# Patient Record
Sex: Female | Born: 1965 | Race: Black or African American | Hispanic: No | Smoking: Never smoker
Health system: Southern US, Community
[De-identification: ages and names within clinical notes are randomized; demographics above are authoritative.]

## PROBLEM LIST (undated history)

## (undated) ENCOUNTER — Emergency Department (HOSPITAL_COMMUNITY): Admission: EM | Payer: Self-pay | Source: Home / Self Care

## (undated) DIAGNOSIS — I10 Essential (primary) hypertension: Secondary | ICD-10-CM

## (undated) DIAGNOSIS — M5430 Sciatica, unspecified side: Secondary | ICD-10-CM

## (undated) DIAGNOSIS — J45909 Unspecified asthma, uncomplicated: Secondary | ICD-10-CM

## (undated) DIAGNOSIS — S83249A Other tear of medial meniscus, current injury, unspecified knee, initial encounter: Secondary | ICD-10-CM

## (undated) DIAGNOSIS — E119 Type 2 diabetes mellitus without complications: Secondary | ICD-10-CM

## (undated) DIAGNOSIS — D219 Benign neoplasm of connective and other soft tissue, unspecified: Secondary | ICD-10-CM

## (undated) HISTORY — PX: ABDOMINAL HYSTERECTOMY: SHX81

## (undated) HISTORY — PX: CHOLECYSTECTOMY: SHX55

## (undated) HISTORY — PX: TUBAL LIGATION: SHX77

---

## 1999-03-24 ENCOUNTER — Encounter: Admission: RE | Admit: 1999-03-24 | Discharge: 1999-04-14 | Payer: Self-pay | Admitting: Unknown Physician Specialty

## 2002-05-22 ENCOUNTER — Emergency Department (HOSPITAL_COMMUNITY): Admission: EM | Admit: 2002-05-22 | Discharge: 2002-05-22 | Payer: Self-pay | Admitting: Emergency Medicine

## 2008-12-15 ENCOUNTER — Emergency Department (HOSPITAL_COMMUNITY): Admission: EM | Admit: 2008-12-15 | Discharge: 2008-12-15 | Payer: Self-pay | Admitting: Emergency Medicine

## 2012-02-26 ENCOUNTER — Encounter (HOSPITAL_COMMUNITY): Payer: Self-pay | Admitting: *Deleted

## 2012-02-26 ENCOUNTER — Emergency Department (HOSPITAL_COMMUNITY): Payer: Self-pay

## 2012-02-26 ENCOUNTER — Emergency Department (HOSPITAL_COMMUNITY)
Admission: EM | Admit: 2012-02-26 | Discharge: 2012-02-26 | Disposition: A | Discharge status: Home / Self Care | Payer: Self-pay | Attending: Emergency Medicine | Admitting: Emergency Medicine

## 2012-02-26 DIAGNOSIS — M25561 Pain in right knee: Secondary | ICD-10-CM

## 2012-02-26 DIAGNOSIS — M5430 Sciatica, unspecified side: Secondary | ICD-10-CM | POA: Insufficient documentation

## 2012-02-26 DIAGNOSIS — M25569 Pain in unspecified knee: Secondary | ICD-10-CM | POA: Insufficient documentation

## 2012-02-26 HISTORY — DX: Sciatica, unspecified side: M54.30

## 2012-02-26 MED ORDER — HYDROCODONE-ACETAMINOPHEN 5-325 MG PO TABS: 1.0000 | ORAL_TABLET | Freq: Four times a day (QID) | ORAL | Status: AC | PRN

## 2012-02-26 NOTE — ED Provider Notes (Signed)
Medical screening examination/treatment/procedure(s) were performed by non-physician practitioner and as supervising physician I was immediately available for consultation/collaboration.  Raeford Razor, MD 02/26/12 1326

## 2012-02-26 NOTE — ED Notes (Signed)
Right knee pain x 4 days ago.  Denies injury.

## 2012-02-26 NOTE — ED Provider Notes (Signed)
History     CSN: [id number]  Arrival date & time 02/26/12  0915   First MD Initiated Contact with Patient 02/26/12 [phone]      Chief Complaint  Patient presents with  . Knee Pain    (Consider location/radiation/quality/duration/timing/severity/associated sxs/prior treatment) HPI Comments: No known injury.  No h/o gout.  No fever.  No pre-existing problem with knee.  Taking aleve with no relief.  No radiation.  Localizes to back of knee and laterally.  Patient is a 46 y.o. female presenting with knee pain. The history is provided by the patient. No language interpreter was used.  Knee Pain This is a new problem. Episode onset: 1 week ago. The problem occurs constantly. The problem has been gradually worsening. Pertinent negatives include no chills, fever or numbness. The symptoms are aggravated by walking and standing. She has tried NSAIDs for the symptoms. The treatment provided no relief.    Past Medical History  Diagnosis Date  . Sciatica     Past Surgical History  Procedure Date  . Tubal ligation   . Cholecystectomy     No family history on file.  History  Substance Use Topics  . Smoking status: Never Smoker   . Smokeless tobacco: Not on file  . Alcohol Use: No    OB History    Grav Para Term Preterm Abortions TAB SAB Ect Mult Living                  Review of Systems  Constitutional: Negative for fever and chills.  Musculoskeletal:       Knee pain   Neurological: Negative for numbness.  All other systems reviewed and are negative.    Allergies  Review of patient's allergies indicates no known allergies.  Home Medications   Current Outpatient Rx  Name Route Sig Dispense Refill  . NAPROXEN SODIUM 220 MG PO TABS Oral Take 220 mg by mouth as needed. For pain      BP 165/88  Pulse 82  Temp 98.1 F (36.7 C) (Oral)  Resp 16  Ht 5\' 4"  (1.626 m)  Wt 280 lb (127.007 kg)  BMI 48.06 kg/m2  SpO2 100%  LMP 02/12/2012  Physical Exam  Nursing note and  vitals reviewed. Constitutional: She is oriented to person, place, and time. She appears well-developed and well-nourished. No distress.  HENT:  Head: Normocephalic and atraumatic.  Eyes: EOM are normal.  Neck: Normal range of motion.  Cardiovascular: Normal rate, regular rhythm and normal heart sounds.   Pulmonary/Chest: Effort normal and breath sounds normal.  Abdominal: Soft. She exhibits no distension. There is no tenderness.  Musculoskeletal: She exhibits tenderness.       Right knee: She exhibits decreased range of motion. She exhibits no swelling, no ecchymosis, no deformity, no laceration and no erythema. tenderness found. Lateral joint line tenderness noted.       Pain primarily with movement.  Localizes posteriorly and laterally.  Difficult to examine secondary to body habitus.  Neurological: She is alert and oriented to person, place, and time.  Skin: Skin is warm and dry.  Psychiatric: She has a normal mood and affect. Judgment normal.    ED Course  Procedures (including critical care time)  Labs Reviewed - No data to display [place] Extrem Low Right Ltd  02/26/2012  *RADIOLOGY REPORT*  Clinical Data: Pain in the popliteal fossa with difficulty bearing weight.  ULTRASOUND RIGHT LOWER EXTREMITY LIMITED  Technique:  Ultrasound examination of the region of interest in  the right lower extremity was performed.  Comparison:  Radiographs 02/26/2012.  Findings: No masses or fluid collections are identified within the right popliteal fossa.  IMPRESSION: No abnormality identified within the popliteal fossa.  If the patient has persistent unexplained pain, further evaluation with MRI should be considered.   Original Report Authenticated By: Gerrianne Scale, M.D.    Dg Knee Complete 4 Views Right  02/26/2012  *RADIOLOGY REPORT*  Clinical Data: Knee pain  RIGHT KNEE - COMPLETE 4+ VIEW  Comparison: None.  Findings: No evidence for fracture or dislocation.  There is no joint effusion.  No  worrisome lytic or sclerotic osseous lesion.  IMPRESSION: Normal exam.   Original Report Authenticated By: ERIC A. MANSELL, M.D.      1. Right knee pain       MDM  Knee immobilizer, crutches, ice, weight bearing as tolerated. rx-hydrocodone, 20 F/u with dr. Romeo Apple.        Evalina Field, [place] 02/26/12 1236

## 2013-09-06 ENCOUNTER — Telehealth: Payer: Self-pay | Admitting: Family Medicine

## 2013-09-06 NOTE — Telephone Encounter (Signed)
Patient seen at [facility] twice over the past 2 weeks.  They suggested an MRI. She doesn't have a primary doctor. Had been seen at [facility] but now has insurance. Appt scheduled with Dr. Ernestina Patches for 09/11/13. Patient aware.

## 2013-09-11 ENCOUNTER — Ambulatory Visit: Payer: Self-pay | Admitting: Family Medicine

## 2013-09-19 ENCOUNTER — Other Ambulatory Visit: Payer: Self-pay | Admitting: Neurosurgery

## 2013-09-19 DIAGNOSIS — M5020 Other cervical disc displacement, unspecified cervical region: Secondary | ICD-10-CM

## 2013-09-24 ENCOUNTER — Ambulatory Visit
Admission: RE | Admit: 2013-09-24 | Discharge: 2013-09-24 | Disposition: A | Discharge status: Home / Self Care | Payer: BC Managed Care – PPO | Source: Ambulatory Visit | Attending: Neurosurgery | Admitting: Neurosurgery

## 2013-09-24 DIAGNOSIS — M5020 Other cervical disc displacement, unspecified cervical region: Secondary | ICD-10-CM

## 2014-09-11 ENCOUNTER — Encounter (HOSPITAL_COMMUNITY): Payer: Self-pay | Admitting: *Deleted

## 2014-09-11 ENCOUNTER — Emergency Department (HOSPITAL_COMMUNITY)
Admission: EM | Admit: 2014-09-11 | Discharge: 2014-09-11 | Disposition: A | Discharge status: Home / Self Care | Payer: Self-pay | Attending: Emergency Medicine | Admitting: Emergency Medicine

## 2014-09-11 DIAGNOSIS — Z9049 Acquired absence of other specified parts of digestive tract: Secondary | ICD-10-CM | POA: Insufficient documentation

## 2014-09-11 DIAGNOSIS — Z8742 Personal history of other diseases of the female genital tract: Secondary | ICD-10-CM | POA: Insufficient documentation

## 2014-09-11 DIAGNOSIS — M5430 Sciatica, unspecified side: Secondary | ICD-10-CM | POA: Insufficient documentation

## 2014-09-11 DIAGNOSIS — Z9851 Tubal ligation status: Secondary | ICD-10-CM | POA: Insufficient documentation

## 2014-09-11 DIAGNOSIS — R1012 Left upper quadrant pain: Secondary | ICD-10-CM | POA: Insufficient documentation

## 2014-09-11 DIAGNOSIS — I10 Essential (primary) hypertension: Secondary | ICD-10-CM | POA: Insufficient documentation

## 2014-09-11 DIAGNOSIS — Z79899 Other long term (current) drug therapy: Secondary | ICD-10-CM | POA: Insufficient documentation

## 2014-09-11 HISTORY — DX: Essential (primary) hypertension: I10

## 2014-09-11 MED ORDER — HYDROMORPHONE HCL 1 MG/ML IJ SOLN
1.0000 mg | Freq: Once | INTRAMUSCULAR | Status: AC
  Administered 2014-09-11: 1 mg via INTRAMUSCULAR
  Filled 2014-09-11: qty 1

## 2014-09-11 MED ORDER — POLYETHYLENE GLYCOL 3350 17 G PO PACK: 17.0000 g | PACK | Freq: Two times a day (BID) | ORAL | Status: DC | PRN

## 2014-09-11 MED ORDER — OXYCODONE-ACETAMINOPHEN 5-325 MG PO TABS: 1.0000 | ORAL_TABLET | ORAL | Status: DC | PRN

## 2014-09-11 MED ORDER — KETOROLAC TROMETHAMINE 30 MG/ML IJ SOLN
30.0000 mg | Freq: Once | INTRAMUSCULAR | Status: AC
  Administered 2014-09-11: 30 mg via INTRAMUSCULAR
  Filled 2014-09-11: qty 1

## 2014-09-11 NOTE — ED Notes (Signed)
abd pain, feels constipated, Seen at [facility] yesterday and had ct of abd  Done.Nausea, no vomiting,  No fever

## 2014-09-11 NOTE — Discharge Instructions (Signed)
Abdominal Pain, Women °Abdominal (stomach, pelvic, or belly) pain can be caused by many things. It is important to tell your doctor: °· The location of the pain. °· Does it come and go or is it present all the time? °· Are there things that start the pain (eating certain foods, exercise)? °· Are there other symptoms associated with the pain (fever, nausea, vomiting, diarrhea)? °All of this is helpful to know when trying to find the cause of the pain. °CAUSES  °· Stomach: virus or bacteria infection, or ulcer. °· Intestine: appendicitis (inflamed appendix), regional ileitis (Crohn's disease), ulcerative colitis (inflamed colon), irritable bowel syndrome, diverticulitis (inflamed diverticulum of the colon), or cancer of the stomach or intestine. °· Gallbladder disease or stones in the gallbladder. °· Kidney disease, kidney stones, or infection. °· Pancreas infection or cancer. °· Fibromyalgia (pain disorder). °· Diseases of the female organs: °¨ Uterus: fibroid (non-cancerous) tumors or infection. °¨ Fallopian tubes: infection or tubal pregnancy. °¨ Ovary: cysts or tumors. °¨ Pelvic adhesions (scar tissue). °¨ Endometriosis (uterus lining tissue growing in the pelvis and on the pelvic organs). °¨ Pelvic congestion syndrome (female organs filling up with blood just before the menstrual period). °¨ Pain with the menstrual period. °¨ Pain with ovulation (producing an egg). °¨ Pain with an IUD (intrauterine device, birth control) in the uterus. °¨ Cancer of the female organs. °· Functional pain (pain not caused by a disease, may improve without treatment). °· Psychological pain. °· Depression. °DIAGNOSIS  °Your doctor will decide the seriousness of your pain by doing an examination. °· Blood tests. °· X-rays. °· Ultrasound. °· CT scan (computed tomography, special type of X-ray). °· MRI (magnetic resonance imaging). °· Cultures, for infection. °· Barium enema (dye inserted in the large intestine, to better view it with  X-rays). °· Colonoscopy (looking in intestine with a lighted tube). °· Laparoscopy (minor surgery, looking in abdomen with a lighted tube). °· Major abdominal exploratory surgery (looking in abdomen with a large incision). °TREATMENT  °The treatment will depend on the cause of the pain.  °· Many cases can be observed and treated at home. °· Over-the-counter medicines recommended by your caregiver. °· Prescription medicine. °· Antibiotics, for infection. °· Birth control pills, for painful periods or for ovulation pain. °· Hormone treatment, for endometriosis. °· Nerve blocking injections. °· Physical therapy. °· Antidepressants. °· Counseling with a psychologist or psychiatrist. °· Minor or major surgery. °HOME CARE INSTRUCTIONS  °· Do not take laxatives, unless directed by your caregiver. °· Take over-the-counter pain medicine only if ordered by your caregiver. Do not take aspirin because it can cause an upset stomach or bleeding. °· Try a clear liquid diet (broth or water) as ordered by your caregiver. Slowly move to a bland diet, as tolerated, if the pain is related to the stomach or intestine. °· Have a thermometer and take your temperature several times a day, and record it. °· Bed rest and sleep, if it helps the pain. °· Avoid sexual intercourse, if it causes pain. °· Avoid stressful situations. °· Keep your follow-up appointments and tests, as your caregiver orders. °· If the pain does not go away with medicine or surgery, you may try: °¨ Acupuncture. °¨ Relaxation exercises (yoga, meditation). °¨ Group therapy. °¨ Counseling. °SEEK MEDICAL CARE IF:  °· You notice certain foods cause stomach pain. °· Your home care treatment is not helping your pain. °· You need stronger pain medicine. °· You want your IUD removed. °· You feel faint or   lightheaded.  You develop nausea and vomiting.  You develop a rash.  You are having side effects or an allergy to your medicine. SEEK IMMEDIATE MEDICAL CARE IF:   Your  pain does not go away or gets worse.  You have a fever.  Your pain is felt only in portions of the abdomen. The right side could possibly be appendicitis. The left lower portion of the abdomen could be colitis or diverticulitis.  You are passing blood in your stools (bright red or black tarry stools, with or without vomiting).  You have blood in your urine.  You develop chills, with or without a fever.  You pass out. MAKE SURE YOU:   Understand these instructions.  Will watch your condition.  Will get help right away if you are not doing well or get worse. Document Released: 04/19/2007 Document Revised: 11/06/2013 Document Reviewed: 05/09/2009 [facility] Patient Information 2015 [place], [place]. This information is not intended to replace advice given to you by your health care provider. Make sure you discuss any questions you have with your health care provider.   Emergency Department Resource Guide 1) Find a Doctor and Pay Out of Pocket Although you won't have to find out who is covered by your insurance plan, it is a good idea to ask around and get recommendations. You will then need to call the office and see if the doctor you have chosen will accept you as a new patient and what types of options they offer for patients who are self-pay. Some doctors offer discounts or will set up payment plans for their patients who do not have insurance, but you will need to ask so you aren't surprised when you get to your appointment.  2) Contact Your Local Health Department Not all health departments have doctors that can see patients for sick visits, but many do, so it is worth a call to see if yours does. If you don't know where your local health department is, you can check in your phone book. The CDC also has a tool to help you locate your state's health department, and many state websites also have listings of all of their local health departments.  3) Find a [facility] If your illness  is not likely to be very severe or complicated, you may want to try a walk in clinic. These are popping up all over the country in pharmacies, drugstores, and shopping centers. They're usually staffed by nurse practitioners or physician assistants that have been trained to treat common illnesses and complaints. They're usually fairly quick and inexpensive. However, if you have serious medical issues or chronic medical problems, these are probably not your best option.  No Primary Care Doctor: - Call Health Connect at  [phone] - they can help you locate a primary care doctor that  accepts your insurance, provides certain services, etc. - Physician Referral Service- [phone]  Chronic Pain Problems: Organization         Address  Phone   Notes  [facility]  [phone] Patients need to be referred by their primary care doctor.   Medication Assistance: Organization         Address  Phone   Notes  [facility] Medication [facility] [address] [place], [place] [address] [phone] --Must be a resident of [facility] -- Must have NO insurance coverage whatsoever (no Medicaid/ Medicare, etc.) -- The pt. MUST have a primary care doctor that directs their care  regularly and follows them in the community   MedAssist  [phone]   [employer]  [phone]    Agencies that provide inexpensive medical care: Organization         Address  Phone   Notes  [place]  [phone]   [facility]    [phone]   [facility] [place], [place] [address] [phone]   [place] [address], [place] [phone]   Planned Parenthood    [phone]   [facility]    3154425303   [place] and [place] [address], [place] Phone:  [phone], Fax:  [phone] Hours of Operation:  9 am - 6  pm, M-F.  Also accepts Medicaid/Medicare and self-pay.  [facility] [address], [address], [place] Phone: [phone], Fax: [phone]. Hours of Operation:  8:30 am - 5:30 pm, M-F.  Also accepts Medicaid and self-pay.  [facility] [place] [address], [place] Phone: [phone]   [place], [place], [place] [phone], Ext. [phone] Mondays & Thursdays: 7-9 AM.  First 15 patients are seen on a first come, first serve basis.    [place] Providers:  Organization         Address  Phone   Notes  [facility] [address],  [phone] Also accepts self-pay patients.  [facility] [address], [place]  [phone]   [place], [address], [place] [phone]   [facility] [address], [place] [phone]   Lucianne Lei [address], [place]   [phone] Only accepts [place] Access [place] patients after they have their name applied to their card.   Self-Pay (no insurance) in [facility]:  Organization         Address  Phone   Notes  Sickle Cell Patients, [facility] [place] [phone]   [facility] [place] [phone]   [facility] [place]  [place], [place],  [phone]   [facility]/Dr. Osei-Bonsu  [address], [place] or [address], [place] [phone] Phone number for both [place] and [place] locations is the same.  Urgent Medical and [facility] [address], [place] [phone]   [facility] [address], [place] or [address] [phone]   [facility] [address] [place] [phone], phone; [phone], fax Sees patients 1st and 3rd Saturday of every month.  Must not qualify for public or private insurance (i.e. Medicaid, Medicare, [place] Health Choice, Veterans' Benefits)  Household income should be no more than 200% of the poverty level The clinic cannot treat you if you are pregnant or think you are pregnant  Sexually transmitted diseases are not treated at the clinic.    Dental Care: Organization         Address  Phone  Notes  [employer] Department of [facility] [address] [place], [place] [phone] Accepts children up to age 55  who are enrolled in Medicaid or [place] Health Choice; pregnant women with a Medicaid card; and children who have applied for Medicaid or [place] Health Choice, but were declined, whose parents can pay a reduced fee at time of service.  [facility] Department of [facility]  [address], [place] [phone] Accepts children up to age 66 who are enrolled in [place] or [place]; pregnant women with a Medicaid card; and children who have applied for Medicaid or [place] Health Choice, but were declined, whose parents can pay a reduced fee at time of service.  [place] Adult Dental Access PROGRAM  [place] [phone] Patients are seen by appointment only. Walk-ins are not accepted. [place] will see patients 17 years of age and older. Monday - Tuesday (8am-5pm) Most Wednesdays (8:30-5pm) $30 per visit, cash only  [facility] Adult Dental Access PROGRAM  [address], [facility] [phone] Patients are seen by appointment only. Walk-ins are not accepted. [place] will see patients 32 years of age and older. One Wednesday Evening (Monthly: Volunteer Based).  $30 per visit, cash only  [place]  [phone] for adults; Children under age 65, call Graduate Pediatric Dentistry at [phone]. Children aged 22-14, please call [phone] to request a pediatric application.  Dental services are provided in all areas of dental care including fillings, crowns and bridges, complete and partial dentures, implants, gum treatment, root canals, and extractions. Preventive care is also provided. Treatment is provided to both adults and children. Patients are selected via a lottery and there is often a waiting list.   [facility] [address] [place]  [phone] [email or web address]   [facility] [address] [place], [place] [phone], Ext. [phone] Second and Fourth Thursday of each month, opens at 6:30 AM; Clinic ends at 9 AM.  Patients are seen on a first-come first-served basis, and a limited number are seen during each clinic.   [facility]  [address] Hillard Danker [place], [place] [phone]   Eligibility Requirements You must have lived in [place], [place], or [place] counties for at least the last three months.   You cannot be eligible for state or federal sponsored [employer], including [employer], [place], or [employer].   You generally cannot be eligible for healthcare insurance through your employer.    How to apply: Eligibility screenings are held every Tuesday and Wednesday afternoon from 1:00 pm until 4:00 pm. You do not need an appointment for the interview!  [facility] [address] [place], [place]   [place]  [place] Department  [facility]  [phone]    Behavioral Health Resources in the Community: Intensive Outpatient Programs Organization         Address  Phone  Notes  [facility] [address], [place], [place] [phone]   [facility] Outpatient [address] [place], [place]   ADS: Alcohol & Drug Svcs  [address] [place], [place]   [place] [address]  [place], [place] or [phone]   Substance Abuse Resources Organization         Address  Phone  Notes  Alcohol and Drug Services  [phone]   [facility]  [place]   Chinita Pester  [phone]   Residential & Outpatient Substance Abuse Program  [phone]   Psychological Services Organization         Address  Phone  Notes  [place]  [phone]   [place] [address], [place] or 7161713744    Mobile Crisis Teams Organization         Address  Phone  Notes  [facility], Mobile Crisis Care Unit  [phone]   [facility] Psychotherapeutic Services  [address] [place], [place]   [place] [address], [place] [phone]    Self-Help/Support Groups Organization         Address  Phone             Notes  [place]. of [place] - variety of support groups  [place] Call for more information  Narcotics Anonymous (NA), [facility] [address], [employer] [place]  2 meetings at this location   Special educational needs teacher         Address  Phone  Notes  [facility] Residential Treatment [place],    [place]  [phone]   [facility]  [address], [place] [address]35, [place], [place]   [place], [place] [phone] Admissions: 8am-3pm M-F  Incentives Substance [place] [address]    [place], [place] [phone]   [facility] [address] [place], [place], [place]   [facility] [address],  [place], [place]   [facility] Programs - Intensive Outpatient [address], [place], [place]   [facility] ([place] [address]  [place], [place] [phone] or [phone]   [facility] Services (RTS) [address] [place], [place] Accepts Medicaid  [facility] [place] [address]  [place] [phone] Substance Abuse/Addiction Treatment   [facility] Organization         Address  Phone  Notes  [facility]  [phone]   Domenic Schwab, PhD [address] Arlis Porta [place], [place]   [phone] or [phone]   [place], [place] [phone]   [facility] [address], [place], [place] [phone] Insurance/Medicaid/sponsorship through [facility] and Families [address]                                    [place], [place] [phone] [place] [address] [place] [address], [place] [phone]    Dr. Adele Schilder  [phone]   [facility] Dept. 1) [address], [place] 2) [place] 3)  [address], [place] [phone]  [phone]   [place] [phone] or [phone] (After Hours)

## 2014-09-12 NOTE — ED Notes (Signed)
Patient expressed interest in [place]. CM set up EE appt for Tuesday 3/15 @ 2pm at [facility] in [place].

## 2014-09-14 ENCOUNTER — Emergency Department (HOSPITAL_COMMUNITY)
Admission: EM | Admit: 2014-09-14 | Discharge: 2014-09-14 | Disposition: A | Discharge status: Home / Self Care | Payer: Self-pay | Attending: Emergency Medicine | Admitting: Emergency Medicine

## 2014-09-14 ENCOUNTER — Encounter (HOSPITAL_COMMUNITY): Payer: Self-pay | Admitting: *Deleted

## 2014-09-14 DIAGNOSIS — R110 Nausea: Secondary | ICD-10-CM

## 2014-09-14 DIAGNOSIS — R109 Unspecified abdominal pain: Secondary | ICD-10-CM

## 2014-09-14 DIAGNOSIS — R1033 Periumbilical pain: Secondary | ICD-10-CM | POA: Insufficient documentation

## 2014-09-14 DIAGNOSIS — D259 Leiomyoma of uterus, unspecified: Secondary | ICD-10-CM | POA: Insufficient documentation

## 2014-09-14 DIAGNOSIS — Z791 Long term (current) use of non-steroidal anti-inflammatories (NSAID): Secondary | ICD-10-CM | POA: Insufficient documentation

## 2014-09-14 DIAGNOSIS — R630 Anorexia: Secondary | ICD-10-CM | POA: Insufficient documentation

## 2014-09-14 DIAGNOSIS — Z79899 Other long term (current) drug therapy: Secondary | ICD-10-CM | POA: Insufficient documentation

## 2014-09-14 DIAGNOSIS — I10 Essential (primary) hypertension: Secondary | ICD-10-CM | POA: Insufficient documentation

## 2014-09-14 DIAGNOSIS — Z3202 Encounter for pregnancy test, result negative: Secondary | ICD-10-CM | POA: Insufficient documentation

## 2014-09-14 HISTORY — DX: Benign neoplasm of connective and other soft tissue, unspecified: D21.9

## 2014-09-14 LAB — CBC WITH DIFFERENTIAL/PLATELET
BASOS ABS: 0 10*3/uL (ref 0.0–0.1)
BASOS PCT: 0 % (ref 0–1)
Eosinophils Absolute: 0.1 10*3/uL (ref 0.0–0.7)
Eosinophils Relative: 1 % (ref 0–5)
HCT: 35.8 % — ABNORMAL LOW (ref 36.0–46.0)
HEMOGLOBIN: 10.7 g/dL — AB (ref 12.0–15.0)
LYMPHS ABS: 2.4 10*3/uL (ref 0.7–4.0)
LYMPHS PCT: 32 % (ref 12–46)
MCH: 22.1 pg — AB (ref 26.0–34.0)
MCHC: 29.9 g/dL — ABNORMAL LOW (ref 30.0–36.0)
MCV: 73.8 fL — AB (ref 78.0–100.0)
MONOS PCT: 3 % (ref 3–12)
Monocytes Absolute: 0.2 10*3/uL (ref 0.1–1.0)
Neutro Abs: 4.7 10*3/uL (ref 1.7–7.7)
Neutrophils Relative %: 63 % (ref 43–77)
Platelets: 259 10*3/uL (ref 150–400)
RBC: 4.85 MIL/uL (ref 3.87–5.11)
RDW: 16.4 % — ABNORMAL HIGH (ref 11.5–15.5)
WBC: 7.4 10*3/uL (ref 4.0–10.5)

## 2014-09-14 LAB — COMPREHENSIVE METABOLIC PANEL
ALBUMIN: 3.6 g/dL (ref 3.5–5.2)
ALT: 39 U/L — ABNORMAL HIGH (ref 0–35)
ANION GAP: 5 (ref 5–15)
AST: 27 U/L (ref 0–37)
Alkaline Phosphatase: 88 U/L (ref 39–117)
BILIRUBIN TOTAL: 0.3 mg/dL (ref 0.3–1.2)
BUN: 10 mg/dL (ref 6–23)
CO2: 29 mmol/L (ref 19–32)
CREATININE: 0.66 mg/dL (ref 0.50–1.10)
Calcium: 8.8 mg/dL (ref 8.4–10.5)
Chloride: 105 mmol/L (ref 96–112)
GFR calc Af Amer: >90 mL/min (ref >90)
GFR calc non Af Amer: >90 mL/min (ref >90)
Glucose, Bld: 119 mg/dL — ABNORMAL HIGH (ref 70–99)
Potassium: 3.9 mmol/L (ref 3.5–5.1)
Sodium: 139 mmol/L (ref 135–145)
TOTAL PROTEIN: 7.1 g/dL (ref 6.0–8.3)

## 2014-09-14 LAB — URINALYSIS, ROUTINE W REFLEX MICROSCOPIC
BILIRUBIN URINE: NEGATIVE
GLUCOSE, UA: NEGATIVE mg/dL
Hgb urine dipstick: NEGATIVE
KETONES UR: NEGATIVE mg/dL
Leukocytes, UA: NEGATIVE
Nitrite: NEGATIVE
PH: 5.5 (ref 5.0–8.0)
Protein, ur: NEGATIVE mg/dL
Specific Gravity, Urine: 1.02 (ref 1.005–1.030)
Urobilinogen, UA: 0.2 mg/dL (ref 0.0–1.0)

## 2014-09-14 LAB — LIPASE, BLOOD: LIPASE: 17 U/L (ref 11–59)

## 2014-09-14 LAB — PREGNANCY, URINE: Preg Test, Ur: NEGATIVE

## 2014-09-14 MED ORDER — HYDROMORPHONE HCL 1 MG/ML IJ SOLN
1.0000 mg | Freq: Once | INTRAMUSCULAR | Status: AC
  Administered 2014-09-14: 1 mg via INTRAVENOUS
  Filled 2014-09-14: qty 1

## 2014-09-14 MED ORDER — ONDANSETRON HCL 4 MG/2ML IJ SOLN
4.0000 mg | Freq: Once | INTRAMUSCULAR | Status: AC
  Administered 2014-09-14: 4 mg via INTRAVENOUS
  Filled 2014-09-14: qty 2

## 2014-09-14 MED ORDER — NAPROXEN 375 MG PO TABS: 375.0000 mg | ORAL_TABLET | Freq: Two times a day (BID) | ORAL | Status: DC | PRN

## 2014-09-14 MED ORDER — SODIUM CHLORIDE 0.9 % IV BOLUS (SEPSIS)
500.0000 mL | Freq: Once | INTRAVENOUS | Status: AC
  Administered 2014-09-14: 500 mL via INTRAVENOUS

## 2014-09-14 NOTE — ED Provider Notes (Signed)
CSN: [id number]     Arrival date & time 09/14/14  1251 History   First MD Initiated Contact with Patient 09/14/14 1455     Chief Complaint  Patient presents with  . Abdominal Pain     (Consider location/radiation/quality/duration/timing/severity/associated sxs/prior Treatment) HPI Comments: 50 year old female with recent diagnosis of fibroid uterus at outside ER, image results reviewed by myself presents with worsening abdominal pain. Initially central now more diffuse. Nausea mild vomiting no blood.  No radiation of pain. Nothing specifically improves the pain worse with eating and palpation. Patient does not have OB/GYN doctor.  Patient is a 49 y.o. female presenting with abdominal pain. The history is provided by the patient.  Abdominal Pain Associated symptoms: diarrhea, nausea and vomiting   Associated symptoms: no chest pain, no chills, no dysuria, no fever and no shortness of breath     Past Medical History  Diagnosis Date  . Sciatica   . Hypertension   . Fibroid tumor    Past Surgical History  Procedure Laterality Date  . Tubal ligation    . Cholecystectomy     No family history on file. History  Substance Use Topics  . Smoking status: Never Smoker   . Smokeless tobacco: Not on file  . Alcohol Use: No   OB History    No data available     Review of Systems  Constitutional: Positive for appetite change. Negative for fever and chills.  HENT: Negative for congestion.   Eyes: Negative for visual disturbance.  Respiratory: Negative for shortness of breath.   Cardiovascular: Negative for chest pain.  Gastrointestinal: Positive for nausea, vomiting, abdominal pain and diarrhea.  Genitourinary: Negative for dysuria and flank pain.  Musculoskeletal: Negative for back pain, neck pain and neck stiffness.  Skin: Negative for rash.  Neurological: Negative for light-headedness and headaches.      Allergies  Review of patient's allergies indicates no known  allergies.  Home Medications   Prior to Admission medications   Medication Sig Start Date End Date Taking? Authorizing Provider  atenolol-chlorthalidone (TENORETIC) 50-25 MG per tablet Take 1 tablet by mouth daily.   Yes Historical Provider, MD  docusate sodium (COLACE) 100 MG capsule Take 100-200 mg by mouth daily.   Yes Historical Provider, MD  HYDROcodone-acetaminophen (NORCO/VICODIN) 5-325 MG per tablet Take 1 tablet by mouth every 6 (six) hours as needed for moderate pain.   Yes Historical Provider, MD  ibuprofen (ADVIL,MOTRIN) 600 MG tablet Take 600 mg by mouth every 6 (six) hours as needed for fever, mild pain or moderate pain.   Yes Historical Provider, MD  polyethylene glycol (MIRALAX / GLYCOLAX) packet Take 17 g by mouth 2 (two) times daily as needed for mild constipation or moderate constipation. 09/11/14  Yes Virgel Manifold, MD  naproxen (NAPROSYN) 375 MG tablet Take 1 tablet (375 mg total) by mouth 2 (two) times daily as needed. 09/14/14   Elnora Morrison, MD  oxyCODONE-acetaminophen (PERCOCET/ROXICET) 5-325 MG per tablet Take 1-2 tablets by mouth every 4 (four) hours as needed. Patient not taking: Reported on 09/14/2014 09/11/14   Virgel Manifold, MD   BP 126/68 mmHg  Pulse 64  Temp(Src) 98.7 F (37.1 C) (Oral)  Resp 18  Ht 5\' 2"  (1.575 m)  Wt 298 lb (135.172 kg)  BMI 54.49 kg/m2  SpO2 100%  LMP 08/31/2014 Physical Exam  Constitutional: She is oriented to person, place, and time. She appears well-developed and well-nourished.  HENT:  Head: Normocephalic and atraumatic.  Eyes: Conjunctivae are normal.  Right eye exhibits no discharge. Left eye exhibits no discharge.  Neck: Normal range of motion. Neck supple. No tracheal deviation present.  Cardiovascular: Normal rate and regular rhythm.   Pulmonary/Chest: Effort normal and breath sounds normal.  Abdominal: Soft. She exhibits no distension. There is tenderness (mild supraumbilical no guarding no peritonitis). There is no guarding.   Musculoskeletal: She exhibits no edema.  Neurological: She is alert and oriented to person, place, and time.  Skin: Skin is warm. No rash noted.  Psychiatric: She has a normal mood and affect.  Nursing note and vitals reviewed.   ED Course  Procedures (including critical care time) Labs Review Labs Reviewed  URINALYSIS, ROUTINE W REFLEX MICROSCOPIC - Abnormal; Notable for the following:    APPearance HAZY (*)    All other components within normal limits  COMPREHENSIVE METABOLIC PANEL - Abnormal; Notable for the following:    Glucose, Bld 119 (*)    ALT 39 (*)    All other components within normal limits  CBC WITH DIFFERENTIAL/PLATELET - Abnormal; Notable for the following:    Hemoglobin 10.7 (*)    HCT 35.8 (*)    MCV 73.8 (*)    MCH 22.1 (*)    MCHC 29.9 (*)    RDW 16.4 (*)    All other components within normal limits  PREGNANCY, URINE  LIPASE, BLOOD    Imaging Review No results found.   EKG Interpretation None      MDM   Final diagnoses:  Uterine leiomyoma, unspecified location  Central abdominal pain  Nausea   Patient with recent CT scan showing uterine fibroids. No nidus on exam, blood work unremarkable, no fever. Well-appearing and pain resolved on recheck. Discussed outpatient follow-up with primary doctor and OB/GYN.  Results and differential diagnosis were discussed with the patient/parent/guardian. Close follow up outpatient was discussed, comfortable with the plan.   Medications  ondansetron (ZOFRAN) injection 4 mg (4 mg Intravenous Given 09/14/14 1544)  HYDROmorphone (DILAUDID) injection 1 mg (1 mg Intravenous Given 09/14/14 1543)  sodium chloride 0.9 % bolus 500 mL (500 mLs Intravenous New Bag/Given 09/14/14 1543)    Filed Vitals:   09/14/14 1254 09/14/14 1615 09/14/14 1630 09/14/14 1645  BP: 153/75  126/68   Pulse: 75 60 69 64  Temp: 98.7 F (37.1 C)     TempSrc: Oral     Resp: 18     Height: 5\' 2"  (1.575 m)     Weight: 298 lb (135.172 kg)      SpO2: 98% 97% 94% 100%    Final diagnoses:  Uterine leiomyoma, unspecified location  Central abdominal pain  Nausea       Elnora Morrison, MD 09/14/14 1708

## 2014-09-14 NOTE — Discharge Instructions (Signed)
If you were given medicines take as directed.  If you are on coumadin or contraceptives realize their levels and effectiveness is altered by many different medicines.  If you have any reaction (rash, tongues swelling, other) to the medicines stop taking and see a physician.   Please follow up as directed and return to the ER or see a physician for new or worsening symptoms.  Thank you. Filed Vitals:   09/14/14 1254 09/14/14 1615 09/14/14 1630 09/14/14 1645  BP: 153/75  126/68   Pulse: 75 60 69 64  Temp: 98.7 F (37.1 C)     TempSrc: Oral     Resp: 18     Height: 5\' 2"  (1.575 m)     Weight: 298 lb (135.172 kg)     SpO2: 98% 97% 94% 100%

## 2014-09-14 NOTE — ED Notes (Signed)
Pt states ongoing upper abdominal pain since last visit Monday. States she was dx with fibroid tumors and needs surgery. Pt states lower abdominal pain began last night with increased upper abdominal pain since last visit. Pt states she was told to come back if pain or swelling became worse. Nausea.

## 2014-09-17 NOTE — ED Provider Notes (Signed)
CSN: [id number]     Arrival date & time 09/11/14  1307 History   First MD Initiated Contact with Patient 09/11/14 1709     Chief Complaint  Patient presents with  . Abdominal Pain     (Consider location/radiation/quality/duration/timing/severity/associated sxs/prior Treatment) HPI   49 year old female with abdominal pain. Reports recent evaluation outside hospital with CT imaging which showed large fibroid uterus with displacement of bowel towards left upper quadrant. No evidence of obstruction. She reports continued symptoms. She has not followed up with gynecology at. No fevers or chills. Mild nausea. No vomiting. No diarrhea. No urinary complaints.  Past Medical History  Diagnosis Date  . Sciatica   . Hypertension   . Fibroid tumor    Past Surgical History  Procedure Laterality Date  . Tubal ligation    . Cholecystectomy     History reviewed. No pertinent family history. History  Substance Use Topics  . Smoking status: Never Smoker   . Smokeless tobacco: Not on file  . Alcohol Use: No   OB History    No data available     Review of Systems  All systems reviewed and negative, other than as noted in HPI.   Allergies  Review of patient's allergies indicates no known allergies.  Home Medications   Prior to Admission medications   Medication Sig Start Date End Date Taking? Authorizing Provider  atenolol-chlorthalidone (TENORETIC) 50-25 MG per tablet Take 1 tablet by mouth daily.   Yes Historical Provider, MD  HYDROcodone-acetaminophen (NORCO/VICODIN) 5-325 MG per tablet Take 1 tablet by mouth every 6 (six) hours as needed for moderate pain.   Yes Historical Provider, MD  ibuprofen (ADVIL,MOTRIN) 600 MG tablet Take 600 mg by mouth every 6 (six) hours as needed for fever, mild pain or moderate pain.   Yes Historical Provider, MD  docusate sodium (COLACE) 100 MG capsule Take 100-200 mg by mouth daily.    Historical Provider, MD  naproxen (NAPROSYN) 375 MG tablet Take 1  tablet (375 mg total) by mouth 2 (two) times daily as needed. 09/14/14   Elnora Morrison, MD  oxyCODONE-acetaminophen (PERCOCET/ROXICET) 5-325 MG per tablet Take 1-2 tablets by mouth every 4 (four) hours as needed. Patient not taking: Reported on 09/14/2014 09/11/14   Virgel Manifold, MD  polyethylene glycol [facility] / Floria Raveling) packet Take 17 g by mouth 2 (two) times daily as needed for mild constipation or moderate constipation. 09/11/14   Virgel Manifold, MD   BP 131/70 mmHg  Pulse 80  Temp(Src) 98 F (36.7 C) (Oral)  Resp 22  Ht 5\' 3"  (1.6 m)  Wt 298 lb (135.172 kg)  BMI 52.80 kg/m2  SpO2 99%  LMP 08/31/2014 Physical Exam  Constitutional: She appears well-developed and well-nourished. No distress.  HENT:  Head: Normocephalic and atraumatic.  Eyes: Conjunctivae are normal. Right eye exhibits no discharge. Left eye exhibits no discharge.  Neck: Neck supple.  Cardiovascular: Normal rate, regular rhythm and normal heart sounds.  Exam reveals no gallop and no friction rub.   No murmur heard. Pulmonary/Chest: Effort normal and breath sounds normal. No respiratory distress.  Abdominal: Soft. She exhibits no distension. There is tenderness.  Obese abdomen. Mild tenderness R quadrant without rebound or guarding. No distention.  Genitourinary:  No CVA tenderness.  Musculoskeletal: She exhibits no edema or tenderness.  Neurological: She is alert.  Skin: Skin is warm and dry.  Psychiatric: She has a normal mood and affect. Her behavior is normal. Thought content normal.  Nursing note and vitals  reviewed.   ED Course  Procedures (including critical care time) Labs Review Labs Reviewed - No data to display  Imaging Review No results found.   EKG Interpretation None      MDM   Final diagnoses:  Left upper quadrant pain    49 year old female with abdominal pain. Recent evaluation outside hospital. Print out of CT report was reviewed. Large fibroid uterus displacing bowel towards left  upper quadrant without signs of obstruction. Otherwise fairly unremarkable. She reports no interim change in her symptoms. Symptomatic treatment. Outpatient OB/GYN follow-up.    Virgel Manifold, MD 09/17/14 1040

## 2014-09-18 ENCOUNTER — Emergency Department (HOSPITAL_COMMUNITY)
Admission: EM | Admit: 2014-09-18 | Discharge: 2014-09-19 | Disposition: A | Discharge status: Home / Self Care | Payer: Self-pay | Attending: Emergency Medicine | Admitting: Emergency Medicine

## 2014-09-18 ENCOUNTER — Encounter (HOSPITAL_COMMUNITY): Payer: Self-pay | Admitting: Adult Health

## 2014-09-18 DIAGNOSIS — Z8739 Personal history of other diseases of the musculoskeletal system and connective tissue: Secondary | ICD-10-CM | POA: Insufficient documentation

## 2014-09-18 DIAGNOSIS — Z79899 Other long term (current) drug therapy: Secondary | ICD-10-CM | POA: Insufficient documentation

## 2014-09-18 DIAGNOSIS — R34 Anuria and oliguria: Secondary | ICD-10-CM | POA: Insufficient documentation

## 2014-09-18 DIAGNOSIS — N898 Other specified noninflammatory disorders of vagina: Secondary | ICD-10-CM | POA: Insufficient documentation

## 2014-09-18 DIAGNOSIS — R51 Headache: Secondary | ICD-10-CM | POA: Insufficient documentation

## 2014-09-18 DIAGNOSIS — I10 Essential (primary) hypertension: Secondary | ICD-10-CM | POA: Insufficient documentation

## 2014-09-18 DIAGNOSIS — R109 Unspecified abdominal pain: Secondary | ICD-10-CM

## 2014-09-18 DIAGNOSIS — K5900 Constipation, unspecified: Secondary | ICD-10-CM | POA: Insufficient documentation

## 2014-09-18 DIAGNOSIS — R300 Dysuria: Secondary | ICD-10-CM | POA: Insufficient documentation

## 2014-09-18 MED ORDER — OXYCODONE-ACETAMINOPHEN 5-325 MG PO TABS
ORAL_TABLET | ORAL | Status: AC
  Filled 2014-09-18: qty 1

## 2014-09-18 MED ORDER — ONDANSETRON 4 MG PO TBDP
8.0000 mg | ORAL_TABLET | Freq: Once | ORAL | Status: AC
  Administered 2014-09-18: 8 mg via ORAL

## 2014-09-18 MED ORDER — ONDANSETRON 4 MG PO TBDP
ORAL_TABLET | ORAL | Status: AC
  Filled 2014-09-18: qty 2

## 2014-09-18 MED ORDER — OXYCODONE-ACETAMINOPHEN 5-325 MG PO TABS
1.0000 | ORAL_TABLET | Freq: Once | ORAL | Status: AC
Start: 2014-09-18 — End: 2014-09-18
  Administered 2014-09-18: 1 via ORAL

## 2014-09-18 NOTE — ED Notes (Signed)
Presents with fibroid tumors that began a while ago-she was recently diagnosed with fibroid tumors-she has been seen multiple times in the past few weeks with no relief of discomfort, swelling, nausea and pain. Abdomen is distended at this time- endorses vaginal discharge and mild to heavy bleeding at times. She was given oxy 5 and Ibuprofen for pain-she has not taken the oxy today.

## 2014-09-19 ENCOUNTER — Emergency Department (HOSPITAL_COMMUNITY): Payer: Self-pay

## 2014-09-19 LAB — WET PREP, GENITAL
TRICH WET PREP: NONE SEEN
YEAST WET PREP: NONE SEEN

## 2014-09-19 LAB — URINALYSIS, ROUTINE W REFLEX MICROSCOPIC
Bilirubin Urine: NEGATIVE
GLUCOSE, UA: NEGATIVE mg/dL
HGB URINE DIPSTICK: NEGATIVE
Ketones, ur: NEGATIVE mg/dL
Nitrite: NEGATIVE
Protein, ur: NEGATIVE mg/dL
SPECIFIC GRAVITY, URINE: 1.024 (ref 1.005–1.030)
Urobilinogen, UA: 1 mg/dL (ref 0.0–1.0)
pH: 5.5 (ref 5.0–8.0)

## 2014-09-19 LAB — URINE MICROSCOPIC-ADD ON

## 2014-09-19 LAB — GC/CHLAMYDIA PROBE AMP (~~LOC~~) NOT AT ARMC
CHLAMYDIA, DNA PROBE: NEGATIVE
NEISSERIA GONORRHEA: NEGATIVE

## 2014-09-19 MED ORDER — ONDANSETRON 4 MG PO TBDP: 4.0000 mg | ORAL_TABLET | Freq: Three times a day (TID) | ORAL | Status: DC | PRN

## 2014-09-19 NOTE — ED Provider Notes (Signed)
CSN: [id number]     Arrival date & time 09/18/14  2126 History   First MD Initiated Contact with Patient 09/19/14 0012     Chief Complaint  Patient presents with  . Abdominal Pain  . Vaginal Discharge     (Consider location/radiation/quality/duration/timing/severity/associated sxs/prior Treatment) HPI Comments: This is a morbidly obese female with recent diagnosis of uterine fibroids.   Has an appointment with OB/GYN in  April. Since Friday she's been unable to sit upright due to the pain.  She also reports that her "urine is hot."  And she has an increase in suprapubic pain and vaginal discharge, nausea, no vomiting.  She has not taken any of her prescribed pain medicine this afternoon .  States she does like to take it if she is driving .  She also reports that she's had to use laxatives to help bowel movement. She is not sexually active  Patient is a 49 y.o. female presenting with abdominal pain and vaginal discharge. The history is provided by the patient.  Abdominal Pain Pain location:  Suprapubic Pain quality: fullness   Pain radiates to:  Does not radiate Pain severity:  Moderate Onset quality:  Gradual Duration:  3 days Timing:  Constant Progression:  Worsening Chronicity:  Recurrent Relieved by:  None tried Worsened by:  Nothing tried Ineffective treatments:  None tried Associated symptoms: dysuria, nausea and vaginal discharge   Associated symptoms: no diarrhea, no fever, no shortness of breath, no vaginal bleeding and no vomiting   Vaginal Discharge Associated symptoms: abdominal pain, dysuria and nausea   Associated symptoms: no fever and no vomiting     Past Medical History  Diagnosis Date  . Sciatica   . Hypertension   . Fibroid tumor    Past Surgical History  Procedure Laterality Date  . Tubal ligation    . Cholecystectomy     History reviewed. No pertinent family history. History  Substance Use Topics  . Smoking status: Never Smoker   . Smokeless  tobacco: Not on file  . Alcohol Use: No   OB History    No data available     Review of Systems  Constitutional: Negative for fever.  Respiratory: Negative for shortness of breath.   Gastrointestinal: Positive for nausea, abdominal pain and abdominal distention. Negative for vomiting, diarrhea and rectal pain.  Genitourinary: Positive for dysuria, decreased urine volume and vaginal discharge. Negative for flank pain and vaginal bleeding.  Neurological: Positive for headaches.  All other systems reviewed and are negative.     Allergies  Review of patient's allergies indicates no known allergies.  Home Medications   Prior to Admission medications   Medication Sig Start Date End Date Taking? Authorizing Provider  atenolol-chlorthalidone (TENORETIC) 50-25 MG per tablet Take 1 tablet by mouth daily.   Yes Historical Provider, MD  docusate sodium (COLACE) 100 MG capsule Take 100-200 mg by mouth daily.   Yes Historical Provider, MD  HYDROcodone-acetaminophen (NORCO/VICODIN) 5-325 MG per tablet Take 1 tablet by mouth every 6 (six) hours as needed for moderate pain.   Yes Historical Provider, MD  ibuprofen (ADVIL,MOTRIN) 600 MG tablet Take 600 mg by mouth every 6 (six) hours as needed for fever, mild pain or moderate pain.   Yes Historical Provider, MD  oxyCODONE-acetaminophen (PERCOCET/ROXICET) 5-325 MG per tablet Take 1-2 tablets by mouth every 4 (four) hours as needed. 09/11/14  Yes Virgel Manifold, MD  polyethylene glycol [facility] / GLYCOLAX) packet Take 17 g by mouth 2 (two) times daily  as needed for mild constipation or moderate constipation. 09/11/14  Yes Virgel Manifold, MD  naproxen (NAPROSYN) 375 MG tablet Take 1 tablet (375 mg total) by mouth 2 (two) times daily as needed. Patient not taking: Reported on 09/18/2014 09/14/14   Elnora Morrison, MD  ondansetron (ZOFRAN-ODT) 4 MG disintegrating tablet Take 1 tablet (4 mg total) by mouth every 8 (eight) hours as needed for nausea or vomiting.  09/19/14   Junius Creamer, NP   BP 123/54 mmHg  Pulse 78  Temp(Src) 98.8 F (37.1 C) (Oral)  Resp 18  Ht 5\' 3"  (1.6 m)  Wt 290 lb (131.543 kg)  BMI 51.38 kg/m2  SpO2 100%  LMP 08/31/2014 Physical Exam  Constitutional: She appears well-developed and well-nourished.  HENT:  Head: Normocephalic.  Eyes: Pupils are equal, round, and reactive to light.  Neck: Normal range of motion.  Cardiovascular: Normal rate.   Pulmonary/Chest: Effort normal.  Abdominal: She exhibits distension. There is no tenderness.  Musculoskeletal: Normal range of motion.  Neurological: She is alert.  Skin: Skin is warm.  Vitals reviewed.   ED Course  Procedures (including critical care time) Labs Review Labs Reviewed  WET PREP, GENITAL - Abnormal; Notable for the following:    Clue Cells Wet Prep HPF POC FEW (*)    WBC, Wet Prep HPF POC FEW (*)    All other components within normal limits  URINALYSIS, ROUTINE W REFLEX MICROSCOPIC - Abnormal; Notable for the following:    Leukocytes, UA TRACE (*)    All other components within normal limits  URINE MICROSCOPIC-ADD ON  GC/CHLAMYDIA PROBE AMP ([facility])    Imaging Review Dg Abd 2 Views  09/19/2014   CLINICAL DATA:  Lower abdominal and left upper quadrant abdominal pain for 1 week. History of constipation.  EXAM: ABDOMEN - 2 VIEW  COMPARISON:  None.  FINDINGS: Examination is degraded due to patient body habitus.  Moderate colonic stool burden, particularly within the cecum and ascending colon. No definite evidence of obstruction. No pneumoperitoneum, pneumatosis or portal venous gas.  Post cholecystectomy.  Multiple phleboliths overlie the lower pelvis bilaterally, right greater than left.  No acute osseus abnormalities. Degenerative change of the lower lumbar spine and bilateral hips is suspected though incompletely evaluated.  IMPRESSION: Moderate colonic stool burden without evidence of obstruction.   Electronically Signed   By: Sandi Mariscal M.D.   On:  09/19/2014 01:48     EKG Interpretation None      MDM   Final diagnoses:  Abdominal pain  Constipation, unspecified constipation type         Junius Creamer, NP 09/19/14 6468  Linton Flemings, MD 09/19/14 [phone]

## 2014-09-19 NOTE — Discharge Instructions (Signed)
You do not have a urinary tract infection, the vaginal discharge is negative for any infection, your x ays does not show a bowel obstruction but does show a large amount od stool in your colon.  Please increase your Colace to 2 times per days drink move water and eat a diet with more fiber and vegetables

## 2014-09-19 NOTE — ED Notes (Signed)
Pt c/o lower abdominal pain and L side swelling. Family reports swelling to L side has been present for at least 6 months. Recent diagnosis of fibroid tumors. Also reports increased bleeding for 10 days that will stop for a couple of days and start back. When bleeding stops, reports foul odor discharge. Also c/o headache and dizziness with position changes.

## 2014-09-24 ENCOUNTER — Encounter: Payer: Self-pay | Admitting: Obstetrics and Gynecology

## 2014-09-24 ENCOUNTER — Ambulatory Visit (INDEPENDENT_AMBULATORY_CARE_PROVIDER_SITE_OTHER): Payer: Self-pay | Admitting: Obstetrics and Gynecology

## 2014-09-24 VITALS — BP 118/76 | Ht 63.0 in | Wt 288.0 lb

## 2014-09-24 DIAGNOSIS — D251 Intramural leiomyoma of uterus: Secondary | ICD-10-CM

## 2014-09-24 MED ORDER — HYDROCODONE-ACETAMINOPHEN 5-325 MG PO TABS: 1.0000 | ORAL_TABLET | Freq: Four times a day (QID) | ORAL | Status: DC | PRN

## 2014-09-24 NOTE — Progress Notes (Addendum)
Patient ID: Robin Murray, female   DOB: 04/18/1966, 49 y.o.   MRN: [id number]    [facility] Visit  Patient name: Robin Murray MRN [id number]  Date of birth: 06-May-1966  CC & HPI:  Robin Murray is a 49 y.o. female s/p tubal ligation presenting today for uterine fibroids. She was seen in the ED on 3/7 for cramping abdominal pain, had a CT scan and was diagnosed with fibroid uterus measuring 14.2 x 12.8 x 10.9 cm. Pt is overdue for a pap smear. 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 She has 10 day menses.  ROS:  A complete 10 system review of systems was obtained and all systems are negative except as noted in the HPI and PMH.    Pertinent History Reviewed:   Reviewed: Significant for tubal ligation Medical         Past Medical History  Diagnosis Date  . Sciatica   . Hypertension   . Fibroid tumor                               Surgical Hx:    Past Surgical History  Procedure Laterality Date  . Tubal ligation    . Cholecystectomy     Medications: Reviewed & Updated - see associated section                       Current outpatient prescriptions:  .  atenolol-chlorthalidone (TENORETIC) 50-25 MG per tablet, Take 1 tablet by mouth daily., Disp: , Rfl:  .  docusate sodium (COLACE) 100 MG capsule, Take 100-200 mg by mouth daily., Disp: , Rfl:  .  naproxen (NAPROSYN) 375 MG tablet, Take 1 tablet (375 mg total) by mouth 2 (two) times daily as needed., Disp: 20 tablet, Rfl: 0   Social History: Reviewed -  reports that she has never smoked. She has never used smokeless tobacco.  Objective Findings:  Vitals: Blood pressure 118/76, height 5\' 3"  (1.6 m), weight 288 lb (130.636 kg), last menstrual period 09/23/2014.  Physical Examination: General appearance - alert, well appearing, and in no distress and oriented to person, place, and time Mental status - alert, oriented to person, place, and time Abdomen - soft, nontender,  nondistended, no masses or organomegaly; small umbilical hernia noted   Assessment & Plan:   A:  1. Umbilical hernia 2. Fibroid noted on uterus, estimated size 1080 g 3. Discussion of surgical removal; including types of hysterectomy  P:1.  Reviewed hyst brochure     2. Return in early April for pap, GC/CHL, endometrial bx     3. Pt applying for ins coverage thru [employer]   Have pt come in April for pap, GC/CHl, endometrial biopsy 1. Biopsy of endometrium; Pap smear 2. Lupron shot?? 3. Hydrocodone 4. Follow-up in 2 weeks for biopsy and pap smear, check hgb     This chart was scribed for Jonnie Kind, MD by Tula Nakayama, ED Scribe. This patient was seen in room 1 and the patient's care was started at 12:10 PM.   I personally performed the services described in this documentation, which was SCRIBED in my presence. The recorded information has been reviewed and considered accurate. It has been edited as necessary during review. Jonnie Kind, MD  .

## 2014-09-24 NOTE — Progress Notes (Signed)
Patient ID: Robin Murray, female   DOB: [birth year], 49 y.o.   MRN: [id number] Pt here today for fibroids. Pt states that she knows she needs to have them surgically removed. Pt wants to discuss this with Dr. Glo Herring.

## 2014-09-25 DIAGNOSIS — D259 Leiomyoma of uterus, unspecified: Secondary | ICD-10-CM | POA: Insufficient documentation

## 2014-10-11 ENCOUNTER — Encounter: Payer: Self-pay | Admitting: Obstetrics and Gynecology

## 2014-10-15 ENCOUNTER — Other Ambulatory Visit (HOSPITAL_COMMUNITY)
Admission: RE | Admit: 2014-10-15 | Discharge: 2014-10-15 | Disposition: A | Discharge status: Home / Self Care | Payer: BLUE CROSS/BLUE SHIELD | Source: Ambulatory Visit | Attending: Obstetrics and Gynecology | Admitting: Obstetrics and Gynecology

## 2014-10-15 ENCOUNTER — Ambulatory Visit (INDEPENDENT_AMBULATORY_CARE_PROVIDER_SITE_OTHER): Payer: BLUE CROSS/BLUE SHIELD | Admitting: Obstetrics and Gynecology

## 2014-10-15 ENCOUNTER — Encounter: Payer: Self-pay | Admitting: Obstetrics and Gynecology

## 2014-10-15 ENCOUNTER — Other Ambulatory Visit (INDEPENDENT_AMBULATORY_CARE_PROVIDER_SITE_OTHER): Payer: BLUE CROSS/BLUE SHIELD | Admitting: Obstetrics and Gynecology

## 2014-10-15 ENCOUNTER — Other Ambulatory Visit: Payer: Self-pay | Admitting: Obstetrics and Gynecology

## 2014-10-15 VITALS — BP 140/82 | Ht 63.0 in | Wt 285.0 lb

## 2014-10-15 DIAGNOSIS — Z1151 Encounter for screening for human papillomavirus (HPV): Secondary | ICD-10-CM | POA: Diagnosis present

## 2014-10-15 DIAGNOSIS — D259 Leiomyoma of uterus, unspecified: Secondary | ICD-10-CM | POA: Diagnosis not present

## 2014-10-15 DIAGNOSIS — Z113 Encounter for screening for infections with a predominantly sexual mode of transmission: Secondary | ICD-10-CM | POA: Insufficient documentation

## 2014-10-15 DIAGNOSIS — Z3202 Encounter for pregnancy test, result negative: Secondary | ICD-10-CM | POA: Diagnosis not present

## 2014-10-15 DIAGNOSIS — Z01818 Encounter for other preprocedural examination: Secondary | ICD-10-CM

## 2014-10-15 DIAGNOSIS — Z3200 Encounter for pregnancy test, result unknown: Secondary | ICD-10-CM

## 2014-10-15 DIAGNOSIS — Z01419 Encounter for gynecological examination (general) (routine) without abnormal findings: Secondary | ICD-10-CM | POA: Diagnosis present

## 2014-10-15 DIAGNOSIS — Z124 Encounter for screening for malignant neoplasm of cervix: Secondary | ICD-10-CM

## 2014-10-15 NOTE — Progress Notes (Signed)
Patient ID: Robin Murray, female   DOB: 07-02-1966, 49 y.o.   MRN: [id number]  Preoperative History and Physical  Robin Murray is a 49 y.o. No obstetric history on file. Pt is here for proposed surgery of abdominal hysterectomy. Pt also here for endometrial biopsy and pap smear. No significant preoperative concerns.   Pt is now covered by the Affordable Care Act.@ 586.00 /month  Proposed surgery: Abdominal hysterectomy  Past Medical History  Diagnosis Date  . Sciatica   . Hypertension   . Fibroid tumor    Past Surgical History  Procedure Laterality Date  . Tubal ligation    . Cholecystectomy     OB History  No data available  Patient denies any other pertinent gynecologic issues.   Current Outpatient Prescriptions on File Prior to Visit  Medication Sig Dispense Refill  . atenolol-chlorthalidone (TENORETIC) 50-25 MG per tablet Take 1 tablet by mouth daily.    Marland Kitchen docusate sodium (COLACE) 100 MG capsule Take 100-200 mg by mouth daily.    . naproxen (NAPROSYN) 375 MG tablet Take 1 tablet (375 mg total) by mouth 2 (two) times daily as needed. 20 tablet 0  . HYDROcodone-acetaminophen (NORCO/VICODIN) 5-325 MG per tablet Take 1 tablet by mouth every 6 (six) hours as needed for moderate pain. (Patient not taking: Reported on 10/15/2014) 30 tablet 0   No current facility-administered medications on file prior to visit.   No Known Allergies  Social History:   reports that she has never smoked. She has never used smokeless tobacco. She reports that she does not drink alcohol or use illicit drugs.  Family History  Problem Relation Age of Onset  . Heart disease Mother   . Heart attack Mother   . Diabetes Mother   . Hypertension Mother   . Cancer Mother     Breast, kidney, thyroid  . Anuerysm Father   . Hypertension Father   . Diabetes Sister   . Hypertension Sister   . Diabetes Brother   . Hypertension Brother   . Diabetes Sister   . Hypertension Sister   . Diabetes Sister   .  Hypertension Sister   . Diabetes Sister   . Hypertension Sister   . Diabetes Brother   . Hypertension Brother   . Diabetes Brother   . Hypertension Brother   . Diabetes Brother   . Hypertension Brother   . Diabetes Brother   . Hypertension Brother   . Diabetes Brother   . Hypertension Brother   . Diabetes Brother   . Hypertension Brother   . Diabetes Brother   . Hypertension Brother   . Diabetes Brother   . Hypertension Brother   . Diabetes Sister   . Hypertension Sister   . Diabetes Sister   . Hypertension Sister     Review of Systems: Noncontributory  PHYSICAL EXAM: Blood pressure 140/82, height 5\' 3"  (1.6 m), weight 285 lb (129.275 kg), last menstrual period 09/23/2014. General appearance - alert, well appearing, and in no distress Chest - clear to auscultation, no wheezes, rales or rhonchi, symmetric air entry Heart - normal rate and regular rhythm Abdomen - soft, nontender, nondistended, no masses or organomegaly;  2 cm inguinal hernia Pelvic - Pelvic exam: normal external genitalia, vulva, vagina, cervix, uterus and adnexa VULVA: normal appearing vulva with no masses, tenderness or lesions VAGINA: normal appearing vagina with normal color and discharge, no lesions CERVIX: normal appearing cervix without discharge or lesions UTERUS: uterus is approximately 1000 g, shape, consistency and  nontender ADNEXA: normal adnexa in size, nontender and no masses. See u/s report Extremities - peripheral pulses normal, no pedal edema, no clubbing or cyanosis  Labs: No results found for this or any previous visit (from the past 336 hour(s)).  Imaging Studies: Dg Abd 2 Views  09/19/2014   CLINICAL DATA:  Lower abdominal and left upper quadrant abdominal pain for 1 week. History of constipation.  EXAM: ABDOMEN - 2 VIEW  COMPARISON:  None.  FINDINGS: Examination is degraded due to patient body habitus.  Moderate colonic stool burden, particularly within the cecum and ascending  colon. No definite evidence of obstruction. No pneumoperitoneum, pneumatosis or portal venous gas.  Post cholecystectomy.  Multiple phleboliths overlie the lower pelvis bilaterally, right greater than left.  No acute osseus abnormalities. Degenerative change of the lower lumbar spine and bilateral hips is suspected though incompletely evaluated.  IMPRESSION: Moderate colonic stool burden without evidence of obstruction.   Electronically Signed   By: Sandi Mariscal M.D.   On: 09/19/2014 01:48    Assessment: Patient Active Problem List   Diagnosis Date Noted  . Fibroid uterus 09/25/2014     Plan: Patient willeventually undergo surgical management with Abdominal Hysterectomy, umbilical hernia repair.. Will await results of pap and endomet biopsy, and then discuss options   Lupron shot to be pursued. Final pre-op visit in 4 weeks  10/15/2014 2:23 PM  This chart was scribed for Jonnie Kind, MD by Tula Nakayama, ED Scribe. This patient was seen in room 2 and the patient's care was started at 2:21 PM.   I personally performed the services described in this documentation, which was SCRIBED in my presence. The recorded information has been reviewed and considered accurate. It has been edited as necessary during review. Jonnie Kind, MD

## 2014-10-16 ENCOUNTER — Telehealth: Payer: Self-pay | Admitting: Obstetrics and Gynecology

## 2014-10-17 ENCOUNTER — Telehealth: Payer: Self-pay | Admitting: Obstetrics and Gynecology

## 2014-10-17 LAB — CYTOLOGY - PAP

## 2014-10-17 NOTE — Telephone Encounter (Signed)
Pt aware of biopsy results and informed me that the Lupron injection had not been sent into her pharmacy. I advised the pt that I would get Dr. Glo Herring to send in the Rx and we should know within a day or so if her insurance is going to cover the medication or if we will have to do a prior authorization. Pt verbalized understanding.

## 2014-10-17 NOTE — Telephone Encounter (Signed)
Message sent to [facility] about sending in Rx for Lupron

## 2014-10-19 ENCOUNTER — Telehealth: Payer: Self-pay | Admitting: Obstetrics and Gynecology

## 2014-10-22 ENCOUNTER — Telehealth: Payer: Self-pay | Admitting: Obstetrics and Gynecology

## 2014-10-22 ENCOUNTER — Other Ambulatory Visit: Payer: Self-pay | Admitting: Obstetrics and Gynecology

## 2014-10-22 DIAGNOSIS — D251 Intramural leiomyoma of uterus: Secondary | ICD-10-CM

## 2014-10-22 LAB — POCT URINE PREGNANCY: Preg Test, Ur: NEGATIVE

## 2014-10-22 MED ORDER — HYDROCODONE-ACETAMINOPHEN 5-325 MG PO TABS: 1.0000 | ORAL_TABLET | Freq: Four times a day (QID) | ORAL | Status: DC | PRN

## 2014-10-22 MED ORDER — LEUPROLIDE ACETATE 3.75 MG IM KIT: 3.7500 mg | PACK | INTRAMUSCULAR | Status: DC

## 2014-10-22 NOTE — Telephone Encounter (Signed)
refil hydrocodone

## 2014-10-22 NOTE — Progress Notes (Signed)
Attempted to call pt but voicemail not set up.

## 2014-10-23 ENCOUNTER — Telehealth: Payer: Self-pay | Admitting: *Deleted

## 2014-10-23 NOTE — Telephone Encounter (Signed)
Pt can't afford medication is too expensive, $600 copay. Pt wants to go ahead and schedule pre op visit and surgery. Pt needs to know ahead of time for work.

## 2014-10-23 NOTE — Progress Notes (Signed)
Pt aware to come by the office to pick up Rx's at the front.

## 2014-10-25 ENCOUNTER — Telehealth: Payer: Self-pay | Admitting: Obstetrics and Gynecology

## 2014-10-25 NOTE — Telephone Encounter (Signed)
Pt reports she has $600.00 copay. Pt has not met deductible, and she believes her insurance is 1000.00 deductible , then 100% covered. PT advised to go ahead with Lupron shot . Pt also to confirm with our insurance Dept if her understanding is correct. Pt to call next week to [place].

## 2014-10-25 NOTE — Telephone Encounter (Signed)
Dr. Glo Herring spoke with the pt and the pt is supposed to call our office back Monday and let him know her decision of what she wants to do.

## 2014-11-12 ENCOUNTER — Encounter: Payer: BLUE CROSS/BLUE SHIELD | Admitting: Obstetrics and Gynecology

## 2015-10-21 ENCOUNTER — Other Ambulatory Visit: Payer: Self-pay | Admitting: *Deleted

## 2015-10-21 NOTE — Telephone Encounter (Signed)
Error

## 2018-01-20 ENCOUNTER — Ambulatory Visit (INDEPENDENT_AMBULATORY_CARE_PROVIDER_SITE_OTHER): Payer: BLUE CROSS/BLUE SHIELD | Admitting: Otolaryngology

## 2019-01-23 ENCOUNTER — Other Ambulatory Visit: Payer: BLUE CROSS/BLUE SHIELD

## 2019-01-23 DIAGNOSIS — Z20822 Contact with and (suspected) exposure to covid-19: Secondary | ICD-10-CM

## 2019-01-25 LAB — NOVEL CORONAVIRUS, NAA: SARS-CoV-2, NAA: NOT DETECTED

## 2019-05-16 ENCOUNTER — Other Ambulatory Visit: Payer: Self-pay

## 2019-05-16 DIAGNOSIS — Z20822 Contact with and (suspected) exposure to covid-19: Secondary | ICD-10-CM

## 2019-05-18 ENCOUNTER — Telehealth: Payer: Self-pay

## 2019-05-18 ENCOUNTER — Telehealth: Payer: Self-pay | Admitting: *Deleted

## 2019-05-18 LAB — NOVEL CORONAVIRUS, NAA: SARS-CoV-2, NAA: NOT DETECTED

## 2019-05-18 NOTE — Telephone Encounter (Signed)
Pt. Given COVID 19 results, verbalizes understanding. 

## 2019-05-18 NOTE — Telephone Encounter (Signed)
Pt called to check on her covid-19 results. Pt advised that her results are not back and she can check back. She voiced understanding.

## 2019-09-08 ENCOUNTER — Ambulatory Visit: Payer: BLUE CROSS/BLUE SHIELD | Attending: Internal Medicine

## 2019-09-08 DIAGNOSIS — Z23 Encounter for immunization: Secondary | ICD-10-CM

## 2019-09-08 NOTE — Progress Notes (Signed)
   Covid-19 Vaccination Clinic  Name:  Robin Murray    MRN: [id number] DOB: May 16, 1966  09/08/2019  [name] was observed post Covid-19 immunization for 15 minutes without incident. She was provided with Vaccine Information Sheet and instruction to access the V-Safe system.   [name] was instructed to call 911 with any severe reactions post vaccine: Marland Kitchen Difficulty breathing  . Swelling of face and throat  . A fast heartbeat  . A bad rash all over body  . Dizziness and weakness   Immunizations Administered    Name Date Dose VIS Date Route   Moderna COVID-19 Vaccine 09/08/2019  8:13 AM 0.5 mL 06/06/2019 Intramuscular   Manufacturer: Moderna   Lot: [id number]   [place]:[id number]

## 2019-10-10 ENCOUNTER — Ambulatory Visit: Payer: BLUE CROSS/BLUE SHIELD | Attending: Internal Medicine

## 2019-10-10 DIAGNOSIS — Z23 Encounter for immunization: Secondary | ICD-10-CM

## 2019-10-10 NOTE — Progress Notes (Signed)
   Covid-19 Vaccination Clinic  Name:  Robin Murray    MRN: [id number] DOB: 04/27/1966  10/10/2019  [name] was observed post Covid-19 immunization for 15 minutes without incident. She was provided with Vaccine Information Sheet and instruction to access the V-Safe system.   [name] was instructed to call 911 with any severe reactions post vaccine: Marland Kitchen Difficulty breathing  . Swelling of face and throat  . A fast heartbeat  . A bad rash all over body  . Dizziness and weakness   Immunizations Administered    Name Date Dose VIS Date Route   Moderna COVID-19 Vaccine 10/10/2019  8:38 AM 0.5 mL 06/06/2019 Intramuscular   Manufacturer: Moderna   LotHQ:[id number]   [place]:[id number]

## 2020-08-13 ENCOUNTER — Ambulatory Visit (INDEPENDENT_AMBULATORY_CARE_PROVIDER_SITE_OTHER): Payer: BLUE CROSS/BLUE SHIELD | Admitting: Gastroenterology

## 2020-08-13 ENCOUNTER — Encounter (INDEPENDENT_AMBULATORY_CARE_PROVIDER_SITE_OTHER): Payer: Self-pay | Admitting: Gastroenterology

## 2020-08-13 ENCOUNTER — Telehealth (INDEPENDENT_AMBULATORY_CARE_PROVIDER_SITE_OTHER): Payer: Self-pay

## 2020-08-13 NOTE — Telephone Encounter (Signed)
No showed for appointment with Dr. Jenetta Downer 08/13/2020

## 2020-08-13 NOTE — Telephone Encounter (Signed)
noted 

## 2020-09-16 ENCOUNTER — Other Ambulatory Visit (HOSPITAL_COMMUNITY): Payer: Self-pay | Admitting: Internal Medicine

## 2020-09-16 DIAGNOSIS — M810 Age-related osteoporosis without current pathological fracture: Secondary | ICD-10-CM

## 2020-09-16 DIAGNOSIS — Z1231 Encounter for screening mammogram for malignant neoplasm of breast: Secondary | ICD-10-CM

## 2020-09-26 ENCOUNTER — Encounter (HOSPITAL_COMMUNITY): Payer: Self-pay | Admitting: Radiology

## 2020-09-26 ENCOUNTER — Ambulatory Visit (HOSPITAL_COMMUNITY)
Admission: RE | Admit: 2020-09-26 | Discharge: 2020-09-26 | Disposition: A | Discharge status: Home / Self Care | Payer: 59 | Source: Ambulatory Visit | Attending: Internal Medicine | Admitting: Internal Medicine

## 2020-09-26 DIAGNOSIS — Z1231 Encounter for screening mammogram for malignant neoplasm of breast: Secondary | ICD-10-CM | POA: Diagnosis present

## 2020-09-26 DIAGNOSIS — M810 Age-related osteoporosis without current pathological fracture: Secondary | ICD-10-CM | POA: Diagnosis not present

## 2020-10-02 ENCOUNTER — Other Ambulatory Visit (HOSPITAL_COMMUNITY): Payer: Self-pay | Admitting: Internal Medicine

## 2020-10-02 DIAGNOSIS — R928 Other abnormal and inconclusive findings on diagnostic imaging of breast: Secondary | ICD-10-CM

## 2020-10-08 ENCOUNTER — Other Ambulatory Visit (HOSPITAL_COMMUNITY): Payer: Self-pay | Admitting: Internal Medicine

## 2020-10-08 ENCOUNTER — Ambulatory Visit (HOSPITAL_COMMUNITY)
Admission: RE | Admit: 2020-10-08 | Discharge: 2020-10-08 | Disposition: A | Discharge status: Home / Self Care | Payer: 59 | Source: Ambulatory Visit | Attending: Internal Medicine | Admitting: Internal Medicine

## 2020-10-08 ENCOUNTER — Other Ambulatory Visit: Payer: Self-pay

## 2020-10-08 DIAGNOSIS — R928 Other abnormal and inconclusive findings on diagnostic imaging of breast: Secondary | ICD-10-CM

## 2020-10-10 ENCOUNTER — Ambulatory Visit (HOSPITAL_COMMUNITY)
Admission: RE | Admit: 2020-10-10 | Discharge: 2020-10-10 | Disposition: A | Discharge status: Home / Self Care | Payer: 59 | Source: Ambulatory Visit | Attending: Internal Medicine | Admitting: Internal Medicine

## 2020-10-10 ENCOUNTER — Other Ambulatory Visit (HOSPITAL_COMMUNITY): Payer: Self-pay | Admitting: Internal Medicine

## 2020-10-10 DIAGNOSIS — R928 Other abnormal and inconclusive findings on diagnostic imaging of breast: Secondary | ICD-10-CM | POA: Insufficient documentation

## 2020-10-10 HISTORY — PX: BREAST BIOPSY: SHX20

## 2020-10-10 MED ORDER — LIDOCAINE-EPINEPHRINE (PF) 1 %-1:200000 IJ SOLN
INTRAMUSCULAR | Status: AC
  Filled 2020-10-10: qty 30

## 2020-10-10 MED ORDER — LIDOCAINE HCL (PF) 2 % IJ SOLN
INTRAMUSCULAR | Status: AC
  Filled 2020-10-10: qty 10

## 2020-10-11 LAB — SURGICAL PATHOLOGY

## 2020-10-16 ENCOUNTER — Encounter (INDEPENDENT_AMBULATORY_CARE_PROVIDER_SITE_OTHER): Payer: Self-pay | Admitting: Gastroenterology

## 2020-10-16 ENCOUNTER — Ambulatory Visit (INDEPENDENT_AMBULATORY_CARE_PROVIDER_SITE_OTHER): Payer: 59 | Admitting: Gastroenterology

## 2020-10-16 ENCOUNTER — Telehealth (INDEPENDENT_AMBULATORY_CARE_PROVIDER_SITE_OTHER): Payer: Self-pay

## 2020-10-16 NOTE — Telephone Encounter (Signed)
Patient no showed for the second time with Dr. Maylon Peppers at [facility] on 08/13/2020 and again on 10/16/2020. Referred by Dr. Sherrie Sport.

## 2021-03-06 ENCOUNTER — Ambulatory Visit: Payer: 59 | Admitting: Orthopaedic Surgery

## 2021-03-06 ENCOUNTER — Other Ambulatory Visit: Payer: Self-pay

## 2021-03-26 ENCOUNTER — Encounter (INDEPENDENT_AMBULATORY_CARE_PROVIDER_SITE_OTHER): Payer: Self-pay | Admitting: *Deleted

## 2021-04-10 ENCOUNTER — Other Ambulatory Visit: Payer: Self-pay

## 2021-04-10 ENCOUNTER — Ambulatory Visit (INDEPENDENT_AMBULATORY_CARE_PROVIDER_SITE_OTHER): Payer: 59 | Admitting: Orthopaedic Surgery

## 2021-04-10 ENCOUNTER — Encounter: Payer: Self-pay | Admitting: Orthopaedic Surgery

## 2021-04-10 VITALS — Ht 63.0 in | Wt 275.0 lb

## 2021-04-10 DIAGNOSIS — M2392 Unspecified internal derangement of left knee: Secondary | ICD-10-CM

## 2021-04-10 NOTE — Progress Notes (Signed)
Office Visit Note   Patient: Robin Murray           Date of Birth: 06/18/1966           MRN: [id number] Visit Date: 04/10/2021              Requested by: Neale Burly, MD [place],  [place] [id number] PCP: Neale Burly, MD   Assessment & Plan: Visit Diagnoses:  1. 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  We will obtain an MRI scan to rule out meniscal tear versus loose body.  Office follow-up after scan for review.  We discussed possibility of knee arthroscopy for her locking symptoms.  Follow-Up Instructions: Return after MRI scan.  Orders:  Orders Placed This Encounter  Procedures   MR Knee Left w/o contrast   No orders of the defined types were placed in this encounter.     Procedures: No procedures performed   Clinical Data: No additional findings.   Subjective: Chief Complaint  Patient presents with   Left Knee - Pain    HPI 55 year old female with left knee locking sometimes in extension sometimes with flexion.  She states she is had problems with it at night with aching and pain is taken some gabapentin also used some Tylenol without significant relief.  She is concerned she may fall has not actually hit the ground.  She has to be careful in getting from sitting to standing.  She had x-rays obtained at [facility] and there was concern about loose pieces of bone in her knee.  After it locks it swells and she has increased pain difficulty walking.  Patient was referred by Dr.Hasanaj.  Patient does have problems with diabetes last A1c was 8.0 she thinks.  She takes metformin also will start amlodipine for blood pressure.  Gabapentin 300 mg 3 times daily.  Does have some acid reflux increased BMI at 48.  Review of Systems all other systems are noncontributory to HPI.   Objective: Vital Signs: Ht 5\' 3"  (1.6 m)   Wt 275 lb (124.7 kg)   LMP 09/23/2014   BMI 48.71 kg/m   Physical  Exam Constitutional:      Appearance: She is well-developed.  HENT:     Head: Normocephalic.     Right Ear: External ear normal.     Left Ear: External ear normal. There is no impacted cerumen.  Eyes:     Pupils: Pupils are equal, round, and reactive to light.  Neck:     Thyroid: No thyromegaly.     Trachea: No tracheal deviation.  Cardiovascular:     Rate and Rhythm: Normal rate.  Pulmonary:     Effort: Pulmonary effort is normal.  Abdominal:     Palpations: Abdomen is soft.  Musculoskeletal:     Cervical back: No rigidity.  Skin:    General: Skin is warm and dry.  Neurological:     Mental Status: She is alert and oriented to person, place, and time.  Psychiatric:        Behavior: Behavior normal.    Ortho Exam Patient has full extension pain with hyperextension.There is collateral ligament balance.  ACL PCL exam is normal.  Palpable medial osteophytes.  Crepitus with knee loading and quadriceps contracture.  No patella subluxation negative logroll the hips.  Specialty Comments:  No specialty comments available.  Imaging: Outside x-rays showed left knee osteoarthritis with medial  spurring joint space narrowing and subchondral sclerosis medial compartment and patellofemoral joint. 2 oh partially calcified bodies appear anterior knee joint 1 anterior to the cruciate ligaments 1 posterior.  PMFS History: Patient Active Problem List   Diagnosis Date Noted   Fibroid uterus 09/25/2014   Past Medical History:  Diagnosis Date   Fibroid tumor    Hypertension    Sciatica     Family History  Problem Relation Age of Onset   Heart disease Mother    Heart attack Mother    Diabetes Mother    Hypertension Mother    Cancer Mother        Breast, kidney, thyroid   Breast cancer Mother    Anuerysm Father    Hypertension Father    Diabetes Sister    Hypertension Sister    Diabetes Brother    Hypertension Brother    Diabetes Sister    Hypertension Sister    Diabetes Sister     Hypertension Sister    Diabetes Sister    Hypertension Sister    Diabetes Brother    Hypertension Brother    Diabetes Brother    Hypertension Brother    Diabetes Brother    Hypertension Brother    Diabetes Brother    Hypertension Brother    Diabetes Brother    Hypertension Brother    Diabetes Brother    Hypertension Brother    Diabetes Brother    Hypertension Brother    Diabetes Brother    Hypertension Brother    Diabetes Sister    Hypertension Sister    Diabetes Sister    Hypertension Sister    Breast cancer Maternal Aunt    Breast cancer Maternal Aunt     Past Surgical History:  Procedure Laterality Date   CHOLECYSTECTOMY     TUBAL LIGATION     Social History   Occupational History   Not on file  Tobacco Use   Smoking status: Never   Smokeless tobacco: Never  Substance and Sexual Activity   Alcohol use: No   Drug use: No   Sexual activity: Not Currently    Birth control/protection: Surgical

## 2021-04-23 ENCOUNTER — Ambulatory Visit (HOSPITAL_COMMUNITY)
Admission: RE | Admit: 2021-04-23 | Discharge: 2021-04-23 | Disposition: A | Discharge status: Home / Self Care | Payer: 59 | Source: Ambulatory Visit | Attending: Orthopaedic Surgery | Admitting: Orthopaedic Surgery

## 2021-04-23 DIAGNOSIS — M2392 Unspecified internal derangement of left knee: Secondary | ICD-10-CM | POA: Diagnosis present

## 2021-04-24 ENCOUNTER — Other Ambulatory Visit: Payer: Self-pay

## 2021-04-24 ENCOUNTER — Ambulatory Visit (INDEPENDENT_AMBULATORY_CARE_PROVIDER_SITE_OTHER): Payer: 59 | Admitting: Orthopaedic Surgery

## 2021-04-24 DIAGNOSIS — M1712 Unilateral primary osteoarthritis, left knee: Secondary | ICD-10-CM | POA: Insufficient documentation

## 2021-04-24 DIAGNOSIS — S83242D Other tear of medial meniscus, current injury, left knee, subsequent encounter: Secondary | ICD-10-CM

## 2021-04-24 DIAGNOSIS — S83249A Other tear of medial meniscus, current injury, unspecified knee, initial encounter: Secondary | ICD-10-CM | POA: Insufficient documentation

## 2021-04-24 NOTE — Progress Notes (Signed)
Office Visit Note   Patient: Robin Murray           Date of Birth: 1966/07/04           MRN: [id number] Visit Date: 04/24/2021              Requested by: Robin Burly, MD [place],  [place] [id number] PCP: Robin Burly, MD   Assessment & Plan: Visit Diagnoses:  1. Acute medial meniscus tear of left knee, subsequent encounter     Plan: Patient has complex medial meniscal tear with portion meniscus stuck in the joint at the time when she had her scan.  Still having repetitive locking sometimes in flexion sometimes in extension.  She may have a bucket-handle component of the tear.  Would recommend proceeding with left knee arthroscopy.  She does have some osteoarthritis some of this could be smoothed to some degree but she understands that knee arthroscopy is not really effective for arthritis but trimming the torn meniscus fragments out should fix her repetitive knee locking problem.  She understands that she does have knee osteoarthritis and needs to work her way down to her goal weight of 225 to get her BMI below 40 since she may eventually require total knee arthroplasty in the future.  Outpatient arthroscopy discussed.  Risk surgery discussed outpatient procedure, questions were elicited and answered she request we proceed.  Follow-Up Instructions: No follow-ups on file.   Orders:  No orders of the defined types were placed in this encounter.  No orders of the defined types were placed in this encounter.     Procedures: No procedures performed   Clinical Data: No additional findings.   Subjective: Chief Complaint  Patient presents with   Left Knee - Follow-up    Here to go over MRI.    HPI 55 year old female returns with left knee locking with sharp pain medial joint line.  Sometimes locked in extension sometimes in flexion always with medial joint line pain.  She has been on Tylenol gabapentin topical cream without relief.  Previous x-rays showed  no loose pieces of bone but she did have some posterior osteophytes and some mild to moderate arthritic changes.  MRI scan has been obtained and it shows complex posterior medial meniscal tear with partial meniscus subluxed in the midportion of the joint.  Difficult determine since the posterior meniscus image on MRI is absent whether this is a bucket-handle or flap based fragment that is flipping into the medial joint line.  Lateral meniscus is intact.  ACL shows the mucinoid degeneration but does not appear torn.  She is using a cane to avoid falling but states although the cane is prevented following repetitive catching is still extremely painful.  Review of Systems negative for rheumatologic conditions positive for morbid obesity uterine fibroids all systems noncontributory to HPI.   Objective: Vital Signs: LMP 09/23/2014 weight 275(124.7 kg) height 5 feet 3 inches BMI 48.71 kg/m  Physical Exam Constitutional:      Appearance: She is well-developed.  HENT:     Head: Normocephalic.     Right Ear: External ear normal.     Left Ear: External ear normal. There is no impacted cerumen.  Eyes:     Pupils: Pupils are equal, round, and reactive to light.  Neck:     Thyroid: No thyromegaly.     Trachea: No tracheal deviation.  Cardiovascular:     Rate and Rhythm: Normal rate.  Pulmonary:  Effort: Pulmonary effort is normal.  Abdominal:     Palpations: Abdomen is soft.  Musculoskeletal:     Cervical back: No rigidity.  Skin:    General: Skin is warm and dry.  Neurological:     Mental Status: She is alert and oriented to person, place, and time.  Psychiatric:        Behavior: Behavior normal.    Ortho Exam exquisite medial joint line tenderness negative Lachman.  With flexion extension against resistance she was able to demonstrate sharp walk and then with manipulation to get her knee back mobile with extreme pain.  Distal pulses intact negative logroll of the hips.  No sciatic notch  tenderness.  Specialty Comments:  No specialty comments available.  Imaging: CLINICAL DATA:  Patient complains of left knee pain. Patient reports he has difficulty ambulating and has repetitive locking that occurs.   EXAM: MRI OF THE LEFT KNEE WITHOUT CONTRAST   TECHNIQUE: Multiplanar, multisequence MR imaging of the knee was performed. No intravenous contrast was administered.   COMPARISON:  None.   FINDINGS: Body habitus reduces diagnostic sensitivity and specificity. Despite efforts by the technologist and patient, motion artifact is present on today's exam and could not be eliminated. This reduces exam sensitivity and specificity.   MENISCI   Medial meniscus: Complex degenerative tearing of the majority of the posterior horn which is highly indistinct and irregular. A small flap of meniscal tissue is thought to extend centrally within the compartment.   Lateral meniscus:  Unremarkable   LIGAMENTS   Cruciates: Mildly accentuated ACL signal may be from degeneration but I do not see an overt ACL tear. PCL intact.   Collaterals: Mild edema tracks adjacent to the MCL. This can be incidental but in the appropriate clinical circumstance could represent grade 1 sprain.   CARTILAGE   Patellofemoral: Mild chondral thinning and heterogeneity along the medial femoral trochlear groove with associated marginal spurring.   Medial: Moderate to prominent degenerative chondral thinning with some irregular chondral edema or small chondral defect centrally in the medial compartment on image 14 series 15. Associated subcortical marrow edema and extensive marginal spurring.   Lateral: Mostly mild chondral thinning although with some more moderate chondral thinning posteromedially along the lateral tibial plateau as on image 12 series 15. Mild marginal spurring.   Joint: No significant knee effusion. No discrete free fragment identified.   Popliteal Fossa: Moderate size  Baker's cyst. Along the inferior margin there is a substantially more septated component.   Extensor Mechanism: Mild lateral patellar tilt. Tibial tubercle-trochlear groove distance 1.5 cm (within normal limits).   Bones: No significant extra-articular osseous abnormalities identified.   Other: No supplemental non-categorized findings.   IMPRESSION: 1. Complex degenerative tearing of the majority of the posterior horn medial meniscus, potentially with a small flap of meniscal tissue extending centrally into the compartment. 2. Striking osteoarthritis particularly in the medial compartment, with prominent associated spurring and moderate to prominent chondral thinning in the medial compartment. 3. I do not see any free chondral or osteochondral fragments. Sensitivity for small fragments reduced due to body habitus and motion artifact. 4. Potential ACL degeneration without tear. 5. Moderate-sized Baker's cyst with septated component inferiorly. 6. Mild lateral patellar tilt without overt subluxation.     Electronically Signed   By: Van Clines M.D.   On: 04/24/2021 13:04   PMFS History: Patient Active Problem List   Diagnosis Date Noted   Acute medial meniscal tear 04/24/2021   Fibroid uterus 09/25/2014  Past Medical History:  Diagnosis Date   Fibroid tumor    Hypertension    Sciatica     Family History  Problem Relation Age of Onset   Heart disease Mother    Heart attack Mother    Diabetes Mother    Hypertension Mother    Cancer Mother        Breast, kidney, thyroid   Breast cancer Mother    Anuerysm Father    Hypertension Father    Diabetes Sister    Hypertension Sister    Diabetes Brother    Hypertension Brother    Diabetes Sister    Hypertension Sister    Diabetes Sister    Hypertension Sister    Diabetes Sister    Hypertension Sister    Diabetes Brother    Hypertension Brother    Diabetes Brother    Hypertension Brother    Diabetes  Brother    Hypertension Brother    Diabetes Brother    Hypertension Brother    Diabetes Brother    Hypertension Brother    Diabetes Brother    Hypertension Brother    Diabetes Brother    Hypertension Brother    Diabetes Brother    Hypertension Brother    Diabetes Sister    Hypertension Sister    Diabetes Sister    Hypertension Sister    Breast cancer Maternal Aunt    Breast cancer Maternal Aunt     Past Surgical History:  Procedure Laterality Date   CHOLECYSTECTOMY     TUBAL LIGATION     Social History   Occupational History   Not on file  Tobacco Use   Smoking status: Never   Smokeless tobacco: Never  Substance and Sexual Activity   Alcohol use: No   Drug use: No   Sexual activity: Not Currently    Birth control/protection: Surgical

## 2021-05-06 ENCOUNTER — Encounter (HOSPITAL_BASED_OUTPATIENT_CLINIC_OR_DEPARTMENT_OTHER): Payer: Self-pay | Admitting: Orthopaedic Surgery

## 2021-05-06 ENCOUNTER — Other Ambulatory Visit: Payer: Self-pay

## 2021-05-07 ENCOUNTER — Other Ambulatory Visit: Payer: Self-pay

## 2021-05-08 ENCOUNTER — Encounter (HOSPITAL_BASED_OUTPATIENT_CLINIC_OR_DEPARTMENT_OTHER)
Admission: RE | Admit: 2021-05-08 | Discharge: 2021-05-08 | Disposition: A | Discharge status: Home / Self Care | Payer: 59 | Source: Ambulatory Visit | Attending: Orthopaedic Surgery | Admitting: Orthopaedic Surgery

## 2021-05-08 DIAGNOSIS — X58XXXD Exposure to other specified factors, subsequent encounter: Secondary | ICD-10-CM | POA: Insufficient documentation

## 2021-05-08 DIAGNOSIS — S83242D Other tear of medial meniscus, current injury, left knee, subsequent encounter: Secondary | ICD-10-CM | POA: Insufficient documentation

## 2021-05-08 DIAGNOSIS — Z01818 Encounter for other preprocedural examination: Secondary | ICD-10-CM | POA: Diagnosis present

## 2021-05-08 LAB — CBC
HCT: 40.3 % (ref 36.0–46.0)
Hemoglobin: 12.6 g/dL (ref 12.0–15.0)
MCH: 25.2 pg — ABNORMAL LOW (ref 26.0–34.0)
MCHC: 31.3 g/dL (ref 30.0–36.0)
MCV: 80.6 fL (ref 80.0–100.0)
Platelets: 195 10*3/uL (ref 150–400)
RBC: 5 MIL/uL (ref 3.87–5.11)
RDW: 14.2 % (ref 11.5–15.5)
WBC: 6.6 10*3/uL (ref 4.0–10.5)
nRBC: 0 % (ref 0.0–0.2)

## 2021-05-08 LAB — BASIC METABOLIC PANEL
Anion gap: 7 (ref 5–15)
BUN: 8 mg/dL (ref 6–20)
CO2: 29 mmol/L (ref 22–32)
Calcium: 9.2 mg/dL (ref 8.9–10.3)
Chloride: 102 mmol/L (ref 98–111)
Creatinine, Ser: 0.45 mg/dL (ref 0.44–1.00)
GFR, Estimated: >60 mL/min (ref >60)
Glucose, Bld: 89 mg/dL (ref 70–99)
Potassium: 4.4 mmol/L (ref 3.5–5.1)
Sodium: 138 mmol/L (ref 135–145)

## 2021-05-08 LAB — SURGICAL PCR SCREEN
MRSA, PCR: NEGATIVE
Staphylococcus aureus: NEGATIVE

## 2021-05-08 MED ORDER — CEFAZOLIN IN SODIUM CHLORIDE 3-0.9 GM/100ML-% IV SOLN: 3.0000 g | INTRAVENOUS | Status: DC

## 2021-05-08 NOTE — Progress Notes (Signed)

## 2021-05-09 ENCOUNTER — Encounter (HOSPITAL_BASED_OUTPATIENT_CLINIC_OR_DEPARTMENT_OTHER): Payer: Self-pay | Admitting: Orthopaedic Surgery

## 2021-05-09 ENCOUNTER — Encounter: Payer: Self-pay | Admitting: Orthopaedic Surgery

## 2021-05-09 ENCOUNTER — Ambulatory Visit (HOSPITAL_BASED_OUTPATIENT_CLINIC_OR_DEPARTMENT_OTHER): Payer: 59 | Admitting: Anesthesiology

## 2021-05-09 ENCOUNTER — Encounter (HOSPITAL_BASED_OUTPATIENT_CLINIC_OR_DEPARTMENT_OTHER)
Admission: RE | Disposition: A | Discharge status: Home / Self Care | Payer: Self-pay | Source: Home / Self Care | Attending: Orthopaedic Surgery

## 2021-05-09 ENCOUNTER — Other Ambulatory Visit: Payer: Self-pay

## 2021-05-09 ENCOUNTER — Ambulatory Visit (HOSPITAL_BASED_OUTPATIENT_CLINIC_OR_DEPARTMENT_OTHER)
Admission: RE | Admit: 2021-05-09 | Discharge: 2021-05-09 | Disposition: A | Discharge status: Home / Self Care | Payer: 59 | Attending: Orthopaedic Surgery | Admitting: Orthopaedic Surgery

## 2021-05-09 DIAGNOSIS — M1712 Unilateral primary osteoarthritis, left knee: Secondary | ICD-10-CM | POA: Diagnosis not present

## 2021-05-09 DIAGNOSIS — S83242A Other tear of medial meniscus, current injury, left knee, initial encounter: Secondary | ICD-10-CM | POA: Diagnosis present

## 2021-05-09 DIAGNOSIS — X58XXXA Exposure to other specified factors, initial encounter: Secondary | ICD-10-CM | POA: Insufficient documentation

## 2021-05-09 DIAGNOSIS — Z6841 Body Mass Index (BMI) 40.0 and over, adult: Secondary | ICD-10-CM | POA: Diagnosis not present

## 2021-05-09 DIAGNOSIS — I10 Essential (primary) hypertension: Secondary | ICD-10-CM | POA: Insufficient documentation

## 2021-05-09 DIAGNOSIS — M23222 Derangement of posterior horn of medial meniscus due to old tear or injury, left knee: Secondary | ICD-10-CM | POA: Diagnosis not present

## 2021-05-09 DIAGNOSIS — Z01818 Encounter for other preprocedural examination: Secondary | ICD-10-CM

## 2021-05-09 DIAGNOSIS — E119 Type 2 diabetes mellitus without complications: Secondary | ICD-10-CM

## 2021-05-09 DIAGNOSIS — Z8249 Family history of ischemic heart disease and other diseases of the circulatory system: Secondary | ICD-10-CM | POA: Insufficient documentation

## 2021-05-09 DIAGNOSIS — S83242D Other tear of medial meniscus, current injury, left knee, subsequent encounter: Secondary | ICD-10-CM

## 2021-05-09 HISTORY — DX: Other tear of medial meniscus, current injury, unspecified knee, initial encounter: S83.249A

## 2021-05-09 HISTORY — PX: KNEE ARTHROSCOPY WITH MEDIAL MENISECTOMY: SHX5651

## 2021-05-09 HISTORY — DX: Unspecified asthma, uncomplicated: J45.909

## 2021-05-09 HISTORY — DX: Type 2 diabetes mellitus without complications: E11.9

## 2021-05-09 LAB — GLUCOSE, CAPILLARY
Glucose-Capillary: 153 mg/dL — ABNORMAL HIGH (ref 70–99)
Glucose-Capillary: 127 mg/dL — ABNORMAL HIGH (ref 70–99)

## 2021-05-09 SURGERY — ARTHROSCOPY, KNEE, WITH MEDIAL MENISCECTOMY
Anesthesia: General | Site: Knee | Laterality: Left

## 2021-05-09 MED ORDER — FENTANYL CITRATE (PF) 100 MCG/2ML IJ SOLN
25.0000 ug | INTRAMUSCULAR | Status: DC | PRN
  Administered 2021-05-09: 25 ug via INTRAVENOUS

## 2021-05-09 MED ORDER — BUPIVACAINE-EPINEPHRINE (PF) 0.5% -1:200000 IJ SOLN
INTRAMUSCULAR | Status: DC | PRN
  Administered 2021-05-09: 20 mL

## 2021-05-09 MED ORDER — LIDOCAINE HCL (PF) 1 % IJ SOLN
INTRAMUSCULAR | Status: AC
  Filled 2021-05-09: qty 30

## 2021-05-09 MED ORDER — PHENYLEPHRINE 40 MCG/ML (10ML) SYRINGE FOR IV PUSH (FOR BLOOD PRESSURE SUPPORT)
PREFILLED_SYRINGE | INTRAVENOUS | Status: AC
  Filled 2021-05-09: qty 10

## 2021-05-09 MED ORDER — EPHEDRINE 5 MG/ML INJ
INTRAVENOUS | Status: AC
  Filled 2021-05-09: qty 5

## 2021-05-09 MED ORDER — PROPOFOL 10 MG/ML IV BOLUS
INTRAVENOUS | Status: DC | PRN
  Administered 2021-05-09: 300 mg via INTRAVENOUS

## 2021-05-09 MED ORDER — SODIUM CHLORIDE 0.9 % IR SOLN
Status: DC | PRN
  Administered 2021-05-09: 3000 mL

## 2021-05-09 MED ORDER — FENTANYL CITRATE (PF) 100 MCG/2ML IJ SOLN
INTRAMUSCULAR | Status: AC
  Filled 2021-05-09: qty 2

## 2021-05-09 MED ORDER — DEXTROSE 5 % IV SOLN
INTRAVENOUS | Status: DC | PRN
  Administered 2021-05-09: 3 g via INTRAVENOUS

## 2021-05-09 MED ORDER — ONDANSETRON HCL 4 MG/2ML IJ SOLN
INTRAMUSCULAR | Status: AC
  Filled 2021-05-09: qty 2

## 2021-05-09 MED ORDER — ACETAMINOPHEN 10 MG/ML IV SOLN
1000.0000 mg | Freq: Once | INTRAVENOUS | Status: DC | PRN
  Administered 2021-05-09: 1000 mg via INTRAVENOUS

## 2021-05-09 MED ORDER — LIDOCAINE 2% (20 MG/ML) 5 ML SYRINGE
INTRAMUSCULAR | Status: AC
  Filled 2021-05-09: qty 5

## 2021-05-09 MED ORDER — SUCCINYLCHOLINE CHLORIDE 200 MG/10ML IV SOSY
PREFILLED_SYRINGE | INTRAVENOUS | Status: AC
  Filled 2021-05-09: qty 10

## 2021-05-09 MED ORDER — DEXAMETHASONE SODIUM PHOSPHATE 10 MG/ML IJ SOLN
INTRAMUSCULAR | Status: AC
  Filled 2021-05-09: qty 1

## 2021-05-09 MED ORDER — CEFAZOLIN IN SODIUM CHLORIDE 3-0.9 GM/100ML-% IV SOLN
INTRAVENOUS | Status: AC
  Filled 2021-05-09: qty 100

## 2021-05-09 MED ORDER — DEXAMETHASONE SODIUM PHOSPHATE 4 MG/ML IJ SOLN
INTRAMUSCULAR | Status: DC | PRN
  Administered 2021-05-09: 10 mg via INTRAVENOUS

## 2021-05-09 MED ORDER — MIDAZOLAM HCL 5 MG/5ML IJ SOLN
INTRAMUSCULAR | Status: DC | PRN
  Administered 2021-05-09: 2 mg via INTRAVENOUS

## 2021-05-09 MED ORDER — ONDANSETRON HCL 4 MG/2ML IJ SOLN
INTRAMUSCULAR | Status: DC | PRN
  Administered 2021-05-09: 4 mg via INTRAVENOUS

## 2021-05-09 MED ORDER — HYDROCODONE-ACETAMINOPHEN 10-325 MG PO TABS: 1.0000 | ORAL_TABLET | Freq: Four times a day (QID) | ORAL | 0 refills | Status: DC | PRN

## 2021-05-09 MED ORDER — BUPIVACAINE HCL (PF) 0.25 % IJ SOLN
INTRAMUSCULAR | Status: AC
  Filled 2021-05-09: qty 60

## 2021-05-09 MED ORDER — ATROPINE SULFATE 0.4 MG/ML IV SOLN
INTRAVENOUS | Status: AC
  Filled 2021-05-09: qty 1

## 2021-05-09 MED ORDER — LACTATED RINGERS IV SOLN: INTRAVENOUS | Status: DC

## 2021-05-09 MED ORDER — LIDOCAINE HCL (CARDIAC) PF 100 MG/5ML IV SOSY
PREFILLED_SYRINGE | INTRAVENOUS | Status: DC | PRN
  Administered 2021-05-09: 80 mg via INTRAVENOUS

## 2021-05-09 MED ORDER — MIDAZOLAM HCL 2 MG/2ML IJ SOLN
INTRAMUSCULAR | Status: AC
  Filled 2021-05-09: qty 2

## 2021-05-09 MED ORDER — FENTANYL CITRATE (PF) 100 MCG/2ML IJ SOLN
INTRAMUSCULAR | Status: DC | PRN
  Administered 2021-05-09 (×2): 50 ug via INTRAVENOUS

## 2021-05-09 MED ORDER — PROMETHAZINE HCL 25 MG/ML IJ SOLN: 6.2500 mg | INTRAMUSCULAR | Status: DC | PRN

## 2021-05-09 MED ORDER — ACETAMINOPHEN 10 MG/ML IV SOLN
INTRAVENOUS | Status: AC
  Filled 2021-05-09: qty 100

## 2021-05-09 SURGICAL SUPPLY — 38 items
APL SKNCLS STERI-STRIP NONHPOA (GAUZE/BANDAGES/DRESSINGS) ×1
BENZOIN TINCTURE PRP APPL 2/3 (GAUZE/BANDAGES/DRESSINGS) ×2 IMPLANT
BLADE EXCALIBUR 4.0X13 (MISCELLANEOUS) IMPLANT
BNDG ELASTIC 6X5.8 VLCR STR LF (GAUZE/BANDAGES/DRESSINGS) ×4 IMPLANT
DISSECTOR  3.8MM X 13CM (MISCELLANEOUS) ×2
DISSECTOR 3.8MM X 13CM (MISCELLANEOUS) ×1 IMPLANT
DRAPE ARTHROSCOPY W/POUCH 90 (DRAPES) ×2 IMPLANT
DRSG TEGADERM 4X4.75 (GAUZE/BANDAGES/DRESSINGS) ×2 IMPLANT
DURAPREP 26ML APPLICATOR (WOUND CARE) ×2 IMPLANT
ELECT MENISCUS 165MM 90D (ELECTRODE) IMPLANT
ELECT REM PT RETURN 9FT ADLT (ELECTROSURGICAL)
ELECTRODE REM PT RTRN 9FT ADLT (ELECTROSURGICAL) IMPLANT
GAUZE SPONGE 4X4 12PLY STRL (GAUZE/BANDAGES/DRESSINGS) ×2 IMPLANT
GLOVE SRG 8 PF TXTR STRL LF DI (GLOVE) ×1 IMPLANT
GLOVE SURG ENC MOIS LTX SZ6.5 (GLOVE) ×2 IMPLANT
GLOVE SURG ENC MOIS LTX SZ7.5 (GLOVE) ×2 IMPLANT
GLOVE SURG POLYISO LF SZ6.5 (GLOVE) ×2 IMPLANT
GLOVE SURG UNDER POLY LF SZ7 (GLOVE) ×6 IMPLANT
GLOVE SURG UNDER POLY LF SZ8 (GLOVE) ×2
GOWN STRL REUS W/ TWL LRG LVL3 (GOWN DISPOSABLE) ×2 IMPLANT
GOWN STRL REUS W/TWL LRG LVL3 (GOWN DISPOSABLE) ×4
HOLDER KNEE FOAM BLUE (MISCELLANEOUS) IMPLANT
MANIFOLD NEPTUNE II (INSTRUMENTS) IMPLANT
PACK ARTHROSCOPY DSU (CUSTOM PROCEDURE TRAY) ×2 IMPLANT
PACK BASIN DAY SURGERY FS (CUSTOM PROCEDURE TRAY) ×2 IMPLANT
PAD CAST 4YDX4 CTTN HI CHSV (CAST SUPPLIES) ×1 IMPLANT
PADDING CAST ABS 4INX4YD NS (CAST SUPPLIES)
PADDING CAST ABS 6INX4YD NS (CAST SUPPLIES)
PADDING CAST ABS COTTON 4X4 ST (CAST SUPPLIES) IMPLANT
PADDING CAST ABS COTTON 6X4 NS (CAST SUPPLIES) IMPLANT
PADDING CAST COTTON 4X4 STRL (CAST SUPPLIES) ×2
PADDING CAST COTTON 6X4 STRL (CAST SUPPLIES) ×2 IMPLANT
PENCIL SMOKE EVACUATOR (MISCELLANEOUS) IMPLANT
STRIP CLOSURE SKIN 1/4X4 (GAUZE/BANDAGES/DRESSINGS) ×2 IMPLANT
TOWEL GREEN STERILE FF (TOWEL DISPOSABLE) ×2 IMPLANT
TUBING ARTHROSCOPY IRRIG 16FT (MISCELLANEOUS) ×2 IMPLANT
WATER STERILE IRR 1000ML POUR (IV SOLUTION) ×2 IMPLANT
WRAP KNEE MAXI GEL POST OP (GAUZE/BANDAGES/DRESSINGS) IMPLANT

## 2021-05-09 NOTE — Anesthesia Postprocedure Evaluation (Signed)
Anesthesia Post Note  Patient: Engineer, manufacturing systems  Procedure(s) Performed: LEFT KNEE ARTHROSCOPY WITH PARTIAL MEDIAL MENISCECTOMY (Left: Knee)     Patient location during evaluation: PACU Anesthesia Type: General Level of consciousness: awake and alert Pain management: pain level controlled Vital Signs Assessment: post-procedure vital signs reviewed and stable Respiratory status: spontaneous breathing, nonlabored ventilation, respiratory function stable and patient connected to nasal cannula oxygen Cardiovascular status: blood pressure returned to baseline and stable Postop Assessment: no apparent nausea or vomiting Anesthetic complications: no   No notable events documented.  Last Vitals:  Vitals:   05/09/21 1242 05/09/21 1254  BP:  (!) 153/88  Pulse: 86 94  Resp: 15 16  Temp:  36.6 C  SpO2: 96% 97%    Last Pain:  Vitals:   05/09/21 1254  TempSrc:   PainSc: 3                  Robin Murray

## 2021-05-09 NOTE — Anesthesia Procedure Notes (Signed)
Procedure Name: LMA Insertion Date/Time: 05/09/2021 11:11 AM Performed by: Willa Frater, CRNA Pre-anesthesia Checklist: Patient identified, Emergency Drugs available, Suction available and Patient being monitored Patient Re-evaluated:Patient Re-evaluated prior to induction Oxygen Delivery Method: Circle system utilized Preoxygenation: Pre-oxygenation with 100% oxygen Induction Type: IV induction Ventilation: Mask ventilation without difficulty LMA: LMA inserted LMA Size: 5.0 Number of attempts: 1 Airway Equipment and Method: Bite block Placement Confirmation: positive ETCO2 Tube secured with: Tape Dental Injury: Teeth and Oropharynx as per pre-operative assessment

## 2021-05-09 NOTE — Interval H&P Note (Signed)
History and Physical Interval Note:  05/09/2021 10:49 AM  Robin Murray  has presented today for surgery, with the diagnosis of LEFT KNEE MEDIAL MENISCAL TEAR WITH LOCKING.  The various methods of treatment have been discussed with the patient and family. After consideration of risks, benefits and other options for treatment, the patient has consented to  Procedure(s): LEFT KNEE ARTHROSCOPY WITH PARTIAL MEDIAL MENISCECTOMY (Left) as a surgical intervention.  The patient's history has been reviewed, patient examined, no change in status, stable for surgery.  I have reviewed the patient's chart and labs.  Questions were answered to the patient's satisfaction.     Marybelle Killings

## 2021-05-09 NOTE — Op Note (Signed)
Preop diagnosis: Left knee medial meniscal tear  Postop diagnosis: Same  Procedure: Diagnostic and operative arthroscopy left knee partial posterior medial meniscectomy.  Surgeon: Lorin Mercy MD  Anesthesia General LMA.  +20 cc Marcaine local  Tourniquet: None  Procedure patient had to have a lateral post due to a large leg size body habitus leg holder would not fit.  Other leg had to be taped with the well leg holder keep the leg from falling off.  Leg was prepped with DuraPrep usual extremity arthroscopy sheets towels were applied and drapes.  Some strips of benign sterilely draped had to be applied proximally on the thigh to keep the arthroscopic drapes from rolling down the leg.  After timeout procedure stab incision was made medial lateral parapatellar tendon portals were used for scope and probe placement.  Knee was then feels traded insufflated with pump at 50 and serially inspected.  There was some grade 3 and 4 in the trochlear groove.  Grade 3 changes on patella no flap tears on the patella.  Lateral compartment showed some fraying of the edge of the meniscus but no meniscal tear ACL PCL were normal.  Medial compartment showed an area grade IV chondromalacia posterior medial complex posterior medial meniscal tear with a flap tear that extended into the joint consistent with her subluxing and locking.  Using combination of the 4.2 Cuda shaver and small straight flat baskets partial posterior medial meniscectomy was performed trimming it back to the meniscal capsular junction involving the posterior one third.  Anterior horn was intact.  Thorough irrigation aspiration tincture benzoin Steri-Strips Tegaderm Marcaine infiltration 4 x 4's ABD web roll and Ace wrap was applied for posterior dressing outpatient surgery is appropriate for the treatment of this condition office follow-up 1 week.

## 2021-05-09 NOTE — Anesthesia Preprocedure Evaluation (Addendum)
Anesthesia Evaluation  Patient identified by MRN, date of birth, ID band Patient awake    Reviewed: Allergy & Precautions, NPO status , Patient's Chart, lab work & pertinent test results  Airway Mallampati: III  TM Distance: >3 FB Neck ROM: Full    Dental  (+) Teeth Intact   Pulmonary asthma ,    Pulmonary exam normal        Cardiovascular hypertension, Pt. on medications  Rhythm:Regular Rate:Normal     Neuro/Psych negative neurological ROS  negative psych ROS   GI/Hepatic negative GI ROS, Neg liver ROS,   Endo/Other  diabetes, Type 2, Oral Hypoglycemic Agents  Renal/GU negative Renal ROS  negative genitourinary   Musculoskeletal  (+) Arthritis , Osteoarthritis,  Meniscal tear   Abdominal Normal abdominal exam  (+)   Peds  Hematology negative hematology ROS (+)   Anesthesia Other Findings   Reproductive/Obstetrics                            Anesthesia Physical Anesthesia Plan  ASA: 3  Anesthesia Plan: General   Post-op Pain Management:    Induction:   PONV Risk Score and Plan: Ondansetron, Dexamethasone, Midazolam and Treatment may vary due to age or medical condition  Airway Management Planned: Mask and LMA  Additional Equipment: None  Intra-op Plan:   Post-operative Plan: Extubation in OR  Informed Consent: I have reviewed the patients History and Physical, chart, labs and discussed the procedure including the risks, benefits and alternatives for the proposed anesthesia with the patient or authorized representative who has indicated his/her understanding and acceptance.     Dental advisory given  Plan Discussed with: CRNA  Anesthesia Plan Comments:        Anesthesia Quick Evaluation

## 2021-05-09 NOTE — H&P (Signed)
Patient: Robin Murray                                       Date of Birth: 18-Feb-1966                                                      MRN: [id number] Visit Date: 04/24/2021                                                                     Requested by: Neale Burly, MD [place],  [place] [id number] PCP: Neale Burly, MD     Assessment & Plan: Visit Diagnoses:  1. Acute medial meniscus tear of left knee, subsequent encounter       Plan: Patient has complex medial meniscal tear with portion meniscus stuck in the joint at the time when she had her scan.  Still having repetitive locking sometimes in flexion sometimes in extension.  She may have a bucket-handle component of the tear.  Would recommend proceeding with left knee arthroscopy.  She does have some osteoarthritis some of this could be smoothed to some degree but she understands that knee arthroscopy is not really effective for arthritis but trimming the torn meniscus fragments out should fix her repetitive knee locking problem.  She understands that she does have knee osteoarthritis and needs to work her way down to her goal weight of 225 to get her BMI below 40 since she may eventually require total knee arthroplasty in the future.  Outpatient arthroscopy discussed.  Risk surgery discussed outpatient procedure, questions were elicited and answered she request we proceed.   Follow-Up Instructions: No follow-ups on file.    Orders:  No orders of the defined types were placed in this encounter.   No orders of the defined types were placed in this encounter.        Procedures: No procedures performed     Clinical Data: No additional findings.     Subjective:     Chief Complaint  Patient presents with   Left Knee - Follow-up      Here to go over MRI.      HPI 55 year old female returns with left knee locking with sharp pain medial joint line.  Sometimes locked in extension sometimes in flexion  always with medial joint line pain.  She has been on Tylenol gabapentin topical cream without relief.  Previous x-rays showed no loose pieces of bone but she did have some posterior osteophytes and some mild to moderate arthritic changes.  MRI scan has been obtained and it shows complex posterior medial meniscal tear with partial meniscus subluxed in the midportion of the joint.  Difficult determine since the posterior meniscus image on MRI is absent whether this is a bucket-handle or flap based fragment that is flipping into the medial joint line.  Lateral meniscus is intact.  ACL shows the mucinoid degeneration but does not appear torn.  She is using a cane to avoid falling  but states although the cane is prevented following repetitive catching is still extremely painful.   Review of Systems negative for rheumatologic conditions positive for morbid obesity uterine fibroids all systems noncontributory to HPI.     Objective: Vital Signs: LMP 09/23/2014 weight 275(124.7 kg) height 5 feet 3 inches BMI 48.71 kg/m   Physical Exam Constitutional:      Appearance: She is well-developed.  HENT:     Head: Normocephalic.     Right Ear: External ear normal.     Left Ear: External ear normal. There is no impacted cerumen.  Eyes:     Pupils: Pupils are equal, round, and reactive to light.  Neck:     Thyroid: No thyromegaly.     Trachea: No tracheal deviation.  Cardiovascular:     Rate and Rhythm: Normal rate.  Pulmonary:     Effort: Pulmonary effort is normal.  Abdominal:     Palpations: Abdomen is soft.  Musculoskeletal:     Cervical back: No rigidity.  Skin:    General: Skin is warm and dry.  Neurological:     Mental Status: She is alert and oriented to person, place, and time.  Psychiatric:        Behavior: Behavior normal.      Ortho Exam exquisite medial joint line tenderness negative Lachman.  With flexion extension against resistance she was able to demonstrate sharp walk and then  with manipulation to get her knee back mobile with extreme pain.  Distal pulses intact negative logroll of the hips.  No sciatic notch tenderness.   Specialty Comments:  No specialty comments available.   Imaging: CLINICAL DATA:  Patient complains of left knee pain. Patient reports he has difficulty ambulating and has repetitive locking that occurs.   EXAM: MRI OF THE LEFT KNEE WITHOUT CONTRAST   TECHNIQUE: Multiplanar, multisequence MR imaging of the knee was performed. No intravenous contrast was administered.   COMPARISON:  None.   FINDINGS: Body habitus reduces diagnostic sensitivity and specificity. Despite efforts by the technologist and patient, motion artifact is present on today's exam and could not be eliminated. This reduces exam sensitivity and specificity.   MENISCI   Medial meniscus: Complex degenerative tearing of the majority of the posterior horn which is highly indistinct and irregular. A small flap of meniscal tissue is thought to extend centrally within the compartment.   Lateral meniscus:  Unremarkable   LIGAMENTS   Cruciates: Mildly accentuated ACL signal may be from degeneration but I do not see an overt ACL tear. PCL intact.   Collaterals: Mild edema tracks adjacent to the MCL. This can be incidental but in the appropriate clinical circumstance could represent grade 1 sprain.   CARTILAGE   Patellofemoral: Mild chondral thinning and heterogeneity along the medial femoral trochlear groove with associated marginal spurring.   Medial: Moderate to prominent degenerative chondral thinning with some irregular chondral edema or small chondral defect centrally in the medial compartment on image 14 series 15. Associated subcortical marrow edema and extensive marginal spurring.   Lateral: Mostly mild chondral thinning although with some more moderate chondral thinning posteromedially along the lateral tibial plateau as on image 12 series 15. Mild  marginal spurring.   Joint: No significant knee effusion. No discrete free fragment identified.   Popliteal Fossa: Moderate size Baker's cyst. Along the inferior margin there is a substantially more septated component.   Extensor Mechanism: Mild lateral patellar tilt. Tibial tubercle-trochlear groove distance 1.5 cm (within normal limits).   Bones:  No significant extra-articular osseous abnormalities identified.   Other: No supplemental non-categorized findings.   IMPRESSION: 1. Complex degenerative tearing of the majority of the posterior horn medial meniscus, potentially with a small flap of meniscal tissue extending centrally into the compartment. 2. Striking osteoarthritis particularly in the medial compartment, with prominent associated spurring and moderate to prominent chondral thinning in the medial compartment. 3. I do not see any free chondral or osteochondral fragments. Sensitivity for small fragments reduced due to body habitus and motion artifact. 4. Potential ACL degeneration without tear. 5. Moderate-sized Baker's cyst with septated component inferiorly. 6. Mild lateral patellar tilt without overt subluxation.     Electronically Signed   By: Van Clines M.D.   On: 04/24/2021 13:04     PMFS History:     Patient Active Problem List    Diagnosis Date Noted   Acute medial meniscal tear 04/24/2021   Fibroid uterus 09/25/2014        Past Medical History:  Diagnosis Date   Fibroid tumor     Hypertension     Sciatica           Family History  Problem Relation Age of Onset   Heart disease Mother     Heart attack Mother     Diabetes Mother     Hypertension Mother     Cancer Mother          Breast, kidney, thyroid   Breast cancer Mother     Anuerysm Father     Hypertension Father     Diabetes Sister     Hypertension Sister     Diabetes Brother     Hypertension Brother     Diabetes Sister     Hypertension Sister     Diabetes Sister      Hypertension Sister     Diabetes Sister     Hypertension Sister     Diabetes Brother     Hypertension Brother     Diabetes Brother     Hypertension Brother     Diabetes Brother     Hypertension Brother     Diabetes Brother     Hypertension Brother     Diabetes Brother     Hypertension Brother     Diabetes Brother     Hypertension Brother     Diabetes Brother     Hypertension Brother     Diabetes Brother     Hypertension Brother     Diabetes Sister     Hypertension Sister     Diabetes Sister     Hypertension Sister     Breast cancer Maternal Aunt     Breast cancer Maternal Aunt           Past Surgical History:  Procedure Laterality Date   CHOLECYSTECTOMY       TUBAL LIGATION        Social History         Occupational History   Not on file  Tobacco Use   Smoking status: Never   Smokeless tobacco: Never  Substance and Sexual Activity   Alcohol use: No   Drug use: No   Sexual activity: Not Currently      Birth control/protection: Surgical

## 2021-05-09 NOTE — Transfer of Care (Signed)
Immediate Anesthesia Transfer of Care Note  Patient: Robin Murray  Procedure(s) Performed: LEFT KNEE ARTHROSCOPY WITH PARTIAL MEDIAL MENISCECTOMY (Left: Knee)  Patient Location: PACU  Anesthesia Type:General  Level of Consciousness: awake, alert  and oriented  Airway & Oxygen Therapy: Patient Spontanous Breathing and Patient connected to face mask oxygen  Post-op Assessment: Report given to RN and Post -op Vital signs reviewed and stable  Post vital signs: Reviewed and stable  Last Vitals:  Vitals Value Taken Time  BP 129/84 05/09/21 1159  Temp    Pulse 106 05/09/21 1159  Resp 14 05/09/21 1159  SpO2 98 % 05/09/21 1159  Vitals shown include unvalidated device data.  Last Pain:  Vitals:   05/09/21 0902  TempSrc: Oral  PainSc: 0-No pain      Patients Stated Pain Goal: 4 (35/68/61 [id number])  Complications: No notable events documented.

## 2021-05-09 NOTE — Discharge Instructions (Addendum)
Remove wrap 48 hours postop, and shower. No tub soaking.  Do no remove steri strips or tegaderm on incisions.  Do not apply any creams or ointments to incisions.    Begin gentle knee range of motion exercises, but nothing aggressive.    Weightbear as tolerated and wean off crutches as comfort allows.    Elevate foot above heart level as much as possible.  Ice knee off an on  as needed.   No driving until further notice.    Post Anesthesia Home Care Instructions  Activity: Get plenty of rest for the remainder of the day. A responsible individual must stay with you for 24 hours following the procedure.  For the next 24 hours, DO NOT: -Drive a car -Paediatric nurse -Drink alcoholic beverages -Take any medication unless instructed by your physician -Make any legal decisions or sign important papers.  Meals: Start with liquid foods such as gelatin or soup. Progress to regular foods as tolerated. Avoid greasy, spicy, heavy foods. If nausea and/or vomiting occur, drink only clear liquids until the nausea and/or vomiting subsides. Call your physician if vomiting continues.  Special Instructions/Symptoms: Your throat may feel dry or sore from the anesthesia or the breathing tube placed in your throat during surgery. If this causes discomfort, gargle with warm salt water. The discomfort should disappear within 24 hours.  If you had a scopolamine patch placed behind your ear for the management of post- operative nausea and/or vomiting:  1. The medication in the patch is effective for 72 hours, after which it should be removed.  Wrap patch in a tissue and discard in the trash. Wash hands thoroughly with soap and water. 2. You may remove the patch earlier than 72 hours if you experience unpleasant side effects which may include dry mouth, dizziness or visual disturbances. 3. Avoid touching the patch. Wash your hands with soap and water after contact with the patch.

## 2021-05-12 ENCOUNTER — Encounter (HOSPITAL_BASED_OUTPATIENT_CLINIC_OR_DEPARTMENT_OTHER): Payer: Self-pay | Admitting: Orthopaedic Surgery

## 2021-05-12 ENCOUNTER — Telehealth: Payer: Self-pay | Admitting: Orthopaedic Surgery

## 2021-05-12 NOTE — Telephone Encounter (Signed)
Pt called stating she got a call about getting an ultrasound? Can you call her please.   Cb [phone]

## 2021-05-12 NOTE — Telephone Encounter (Signed)
I called patient. She states that she had a voicemail from Radiology wanting to schedule her ultrasound and is unsure what they are talking about. She thinks it could be a message prior to surgery. I advised I do not see anything in the chart about an ultrasound being ordered.  Could you please advise? Did you need ultrasound?

## 2021-05-13 NOTE — Addendum Note (Signed)
Addendum  created 05/13/21 0605 by Glory Buff, CRNA   Charge Capture section accepted

## 2021-05-13 NOTE — Telephone Encounter (Signed)
I called patient and advised. 

## 2021-05-15 ENCOUNTER — Other Ambulatory Visit: Payer: Self-pay

## 2021-05-15 ENCOUNTER — Ambulatory Visit (INDEPENDENT_AMBULATORY_CARE_PROVIDER_SITE_OTHER): Payer: 59 | Admitting: Orthopaedic Surgery

## 2021-05-15 ENCOUNTER — Encounter: Payer: Self-pay | Admitting: Orthopaedic Surgery

## 2021-05-15 VITALS — Ht 63.0 in | Wt 280.0 lb

## 2021-05-15 DIAGNOSIS — S83242D Other tear of medial meniscus, current injury, left knee, subsequent encounter: Secondary | ICD-10-CM

## 2021-05-15 DIAGNOSIS — M1712 Unilateral primary osteoarthritis, left knee: Secondary | ICD-10-CM

## 2021-05-15 NOTE — Progress Notes (Signed)
Post-Op Visit Note   Patient: Robin Murray           Date of Birth: 10/20/1965           MRN: [id number] Visit Date: 05/15/2021 PCP: Neale Burly, MD   Assessment & Plan:  Chief Complaint:  Chief Complaint  Patient presents with   Left Knee - Routine Post Op    05/09/2021 left knee arthroscopy   Visit Diagnoses:  1. Acute medial meniscus tear of left knee, subsequent encounter   2. Unilateral primary osteoarthritis, left knee     Plan: Follow-up knee arthroscopy partial medial meniscectomy posteriorly.  Area of exposed subchondral bone posterior medial tibia.  Relative sparing of the lateral compartment.  Trochlear groove grade III and IV chondromalacia was present.  Continue ice intermittently Aleve 2 p.o. twice daily arthroscopic photo pictures reviewed I gave her a copy.  Recheck 5 weeks.  She works from home and is gradually increasing her activity.  We discussed goal weight BMI to 25 to get her BMI below 40 so that if she had progressive symptoms in her knee she would have arthroplasty options.  Recheck 5 weeks.  Follow-Up Instructions: Return in about 5 weeks (around 06/19/2021).   Orders:  No orders of the defined types were placed in this encounter.  No orders of the defined types were placed in this encounter.   Imaging: No results found.  PMFS History: Patient Active Problem List   Diagnosis Date Noted   Acute medial meniscal tear 04/24/2021   Unilateral primary osteoarthritis, left knee 04/24/2021   Fibroid uterus 09/25/2014   Past Medical History:  Diagnosis Date   Asthma    hx of, no problems now   Diabetes mellitus without complication ([place])    Fibroid tumor    Hypertension    MMT (medial meniscus tear)    left   Sciatica     Family History  Problem Relation Age of Onset   Heart disease Mother    Heart attack Mother    Diabetes Mother    Hypertension Mother    Cancer Mother        Breast, kidney, thyroid   Breast cancer Mother     Anuerysm Father    Hypertension Father    Diabetes Sister    Hypertension Sister    Diabetes Brother    Hypertension Brother    Diabetes Sister    Hypertension Sister    Diabetes Sister    Hypertension Sister    Diabetes Sister    Hypertension Sister    Diabetes Brother    Hypertension Brother    Diabetes Brother    Hypertension Brother    Diabetes Brother    Hypertension Brother    Diabetes Brother    Hypertension Brother    Diabetes Brother    Hypertension Brother    Diabetes Brother    Hypertension Brother    Diabetes Brother    Hypertension Brother    Diabetes Brother    Hypertension Brother    Diabetes Sister    Hypertension Sister    Diabetes Sister    Hypertension Sister    Breast cancer Maternal Aunt    Breast cancer Maternal Aunt     Past Surgical History:  Procedure Laterality Date   ABDOMINAL HYSTERECTOMY     CHOLECYSTECTOMY     KNEE ARTHROSCOPY WITH MEDIAL MENISECTOMY Left 05/09/2021   Procedure: LEFT KNEE ARTHROSCOPY WITH PARTIAL MEDIAL MENISCECTOMY;  Surgeon: Marybelle Killings, MD;  Location: [facility];  Service: Orthopedics;  Laterality: Left;   TUBAL LIGATION     Social History   Occupational History   Not on file  Tobacco Use   Smoking status: Never   Smokeless tobacco: Never  Substance and Sexual Activity   Alcohol use: No   Drug use: No   Sexual activity: Not Currently    Birth control/protection: Surgical

## 2021-06-19 ENCOUNTER — Encounter: Payer: 59 | Admitting: Orthopaedic Surgery

## 2021-06-19 ENCOUNTER — Other Ambulatory Visit: Payer: Self-pay

## 2021-09-03 ENCOUNTER — Encounter (INDEPENDENT_AMBULATORY_CARE_PROVIDER_SITE_OTHER): Payer: Self-pay | Admitting: *Deleted

## 2021-09-23 ENCOUNTER — Ambulatory Visit: Payer: 59 | Admitting: Urology

## 2021-09-23 ENCOUNTER — Other Ambulatory Visit: Payer: Self-pay

## 2021-09-23 ENCOUNTER — Encounter: Payer: Self-pay | Admitting: Urology

## 2021-09-23 VITALS — BP 146/90 | HR 97 | Ht 63.0 in | Wt 275.0 lb

## 2021-09-23 DIAGNOSIS — N8111 Cystocele, midline: Secondary | ICD-10-CM

## 2021-09-23 DIAGNOSIS — N393 Stress incontinence (female) (male): Secondary | ICD-10-CM

## 2021-09-23 LAB — URINALYSIS, ROUTINE W REFLEX MICROSCOPIC
Bilirubin, UA: NEGATIVE
Glucose, UA: NEGATIVE
Ketones, UA: NEGATIVE
Leukocytes,UA: NEGATIVE
Nitrite, UA: NEGATIVE
Protein,UA: NEGATIVE
RBC, UA: NEGATIVE
Specific Gravity, UA: 1.025 (ref 1.005–1.030)
Urobilinogen, Ur: 1 mg/dL (ref 0.2–1.0)
pH, UA: 5.5 (ref 5.0–7.5)

## 2021-09-23 NOTE — Progress Notes (Signed)
? ?Assessment: ?1. Stress incontinence of urine   ?2. Cystocele, midline   ? ? ?Plan: ?Diagnosis and management of stress incontinence discussed with the patient.  Options including pelvic floor exercises, biofeedback, incontinence pessary use, and surgical management discussed. ?Diagnosis and management of a cystocele discussed with the patient as well.  Options including observation, pessary, and surgical management reviewed. ?Patient educational material provided. ?She would like to monitor her for symptoms at this time. ?Continue pelvic floor exercises. ?Return to office as needed. ? ?Chief Complaint:  ?Chief Complaint  ?Patient presents with  ? vaginal bulge  ? ? ?History of Present Illness: ? ?Robin Murray is a 56 y.o. year old female who is seen in consultation from Neale Burly, MD for evaluation of a vaginal bulge and stress incontinence.  She has noted a bulge in the vaginal area for several years.  She reports this is bothersome.  She does have a history of stress urinary incontinence.  She has leakage associated with coughing, straining, and movements.  She is using 2 maxi pads per day.  No urge incontinence.  She reports urinary frequency but states that she drinks a large amount of water throughout the day.  She has nocturia x2.  No dysuria or gross hematuria.  No recent UTIs.  She does report problems with constipation. ?She is status post a hysterectomy approximately 8 years ago for fibroids. ? ? ?Past Medical History:  ?Past Medical History:  ?Diagnosis Date  ? Asthma   ? hx of, no problems now  ? Diabetes mellitus without complication ([place])   ? Fibroid tumor   ? Hypertension   ? MMT (medial meniscus tear)   ? left  ? Sciatica   ? ? ?Past Surgical History:  ?Past Surgical History:  ?Procedure Laterality Date  ? ABDOMINAL HYSTERECTOMY    ? CHOLECYSTECTOMY    ? KNEE ARTHROSCOPY WITH MEDIAL MENISECTOMY Left 05/09/2021  ? Procedure: LEFT KNEE ARTHROSCOPY WITH PARTIAL MEDIAL MENISCECTOMY;   Surgeon: Marybelle Killings, MD;  Location: [facility];  Service: Orthopedics;  Laterality: Left;  ? TUBAL LIGATION    ? ? ?Allergies:  ?Allergies  ?Allergen Reactions  ? Lisinopril   ? ? ?Family History:  ?Family History  ?Problem Relation Age of Onset  ? Heart disease Mother   ? Heart attack Mother   ? Diabetes Mother   ? Hypertension Mother   ? Cancer Mother   ?     Breast, kidney, thyroid  ? Breast cancer Mother   ? Anuerysm Father   ? Hypertension Father   ? Diabetes Sister   ? Hypertension Sister   ? Diabetes Brother   ? Hypertension Brother   ? Diabetes Sister   ? Hypertension Sister   ? Diabetes Sister   ? Hypertension Sister   ? Diabetes Sister   ? Hypertension Sister   ? Diabetes Brother   ? Hypertension Brother   ? Diabetes Brother   ? Hypertension Brother   ? Diabetes Brother   ? Hypertension Brother   ? Diabetes Brother   ? Hypertension Brother   ? Diabetes Brother   ? Hypertension Brother   ? Diabetes Brother   ? Hypertension Brother   ? Diabetes Brother   ? Hypertension Brother   ? Diabetes Brother   ? Hypertension Brother   ? Diabetes Sister   ? Hypertension Sister   ? Diabetes Sister   ? Hypertension Sister   ? Breast cancer Maternal Aunt   ?  Breast cancer Maternal Aunt   ? ? ?Social History:  ?Social History  ? ?Tobacco Use  ? Smoking status: Never  ? Smokeless tobacco: Never  ?Substance Use Topics  ? Alcohol use: No  ? Drug use: No  ? ? ?Review of symptoms:  ?Constitutional:  Negative for unexplained weight loss, night sweats, fever, chills ?ENT:  Negative for nose bleeds, sinus pain, painful swallowing ?CV:  Negative for chest pain, shortness of breath, exercise intolerance, palpitations, loss of consciousness ?Resp:  Negative for cough, wheezing, shortness of breath ?GI:  Negative for nausea, vomiting, diarrhea, bloody stools ?GU:  Positives noted in HPI; otherwise negative for gross hematuria, dysuria ?Neuro:  Negative for seizures, poor balance, limb weakness, slurred speech ?Psych:   Negative for lack of energy, depression, anxiety ?Endocrine:  Negative for polydipsia, polyuria, symptoms of hypoglycemia (dizziness, hunger, sweating) ?Hematologic:  Negative for anemia, purpura, petechia, prolonged or excessive bleeding, use of anticoagulants  ?Allergic:  Negative for difficulty breathing or choking as a result of exposure to anything; no shellfish allergy; no allergic response (rash/itch) to materials, foods ? ?Physical exam: ?BP (!) 146/90   Pulse 97   Ht '5\' 3"'$  (1.6 m)   Wt 275 lb (124.7 kg)   LMP 09/23/2014   BMI 48.71 kg/m?  ?GENERAL APPEARANCE:  Well appearing, well developed, well nourished, NAD ?HEENT: Atraumatic, Normocephalic, oropharynx clear. ?NECK: Supple without lymphadenopathy or thyromegaly. ?LUNGS: Clear to auscultation bilaterally. ?HEART: Regular Rate and Rhythm without murmurs, gallops, or rubs. ?ABDOMEN: Soft, non-tender, No Masses. ?EXTREMITIES: Moves all extremities well.  Without clubbing, cyanosis, or edema. ?NEUROLOGIC:  Alert and oriented x 3, normal gait, CN II-XII grossly intact.  ?MENTAL STATUS:  Appropriate. ?BACK:  Non-tender to palpation.  No CVAT ?SKIN:  Warm, dry and intact.   ?GU: ?Urethra: hypernobility; leakage with cough ?Vagina: normal mucosa; grade 2 cystocele ? ? ?Results: ?U/A dipstick negative ? ?I&O cath:  20 ml ?

## 2022-05-27 IMAGING — MG MM DIGITAL SCREENING BILAT W/ TOMO AND CAD
8 of 14 series · 8 of 40 positions shown · non-contrast
Comparison: None.

CLINICAL DATA: Screening.

EXAM:
DIGITAL SCREENING BILATERAL MAMMOGRAM WITH TOMOSYNTHESIS AND CAD
TECHNIQUE: Bilateral screening digital craniocaudal and mediolateral oblique
mammograms were obtained. Bilateral screening digital breast
tomosynthesis was performed. The images were evaluated with
computer-aided detection.

[R CC synth-2D (1 of 2)]
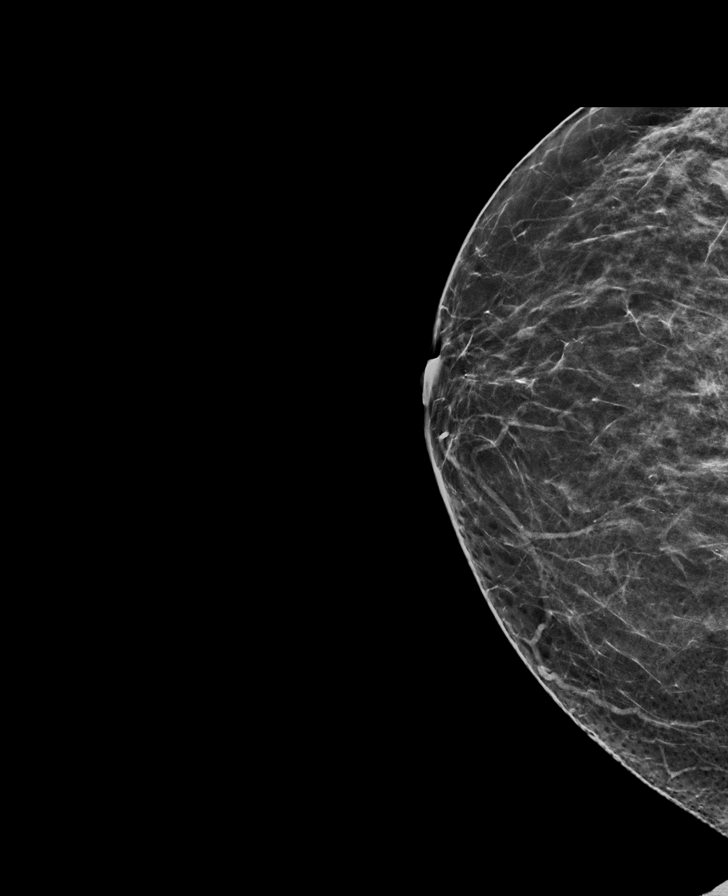

[L CC synth-2D (1 of 2)]
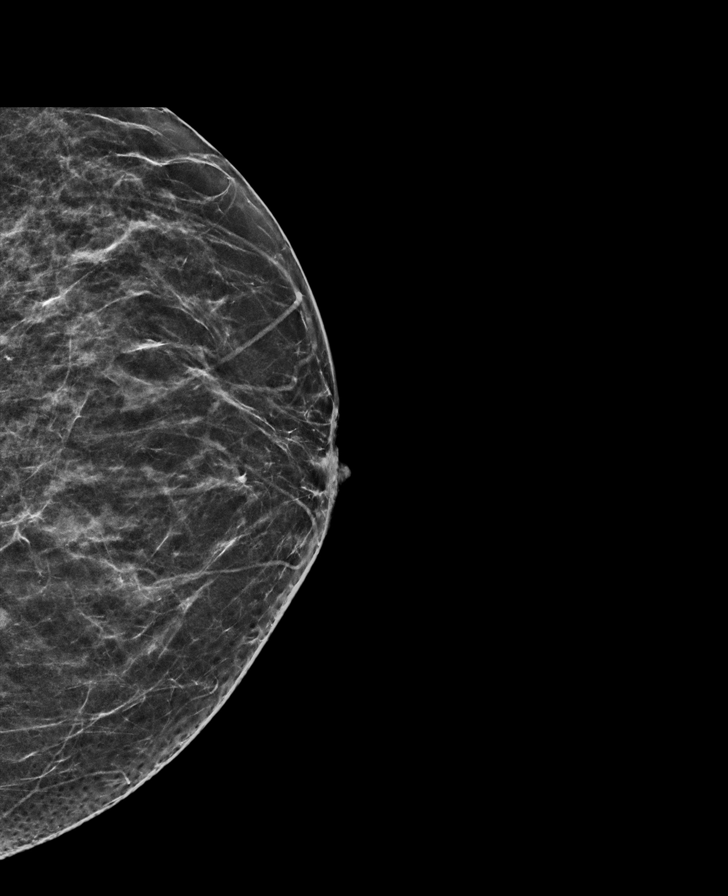

[R XCCL synth-2D]
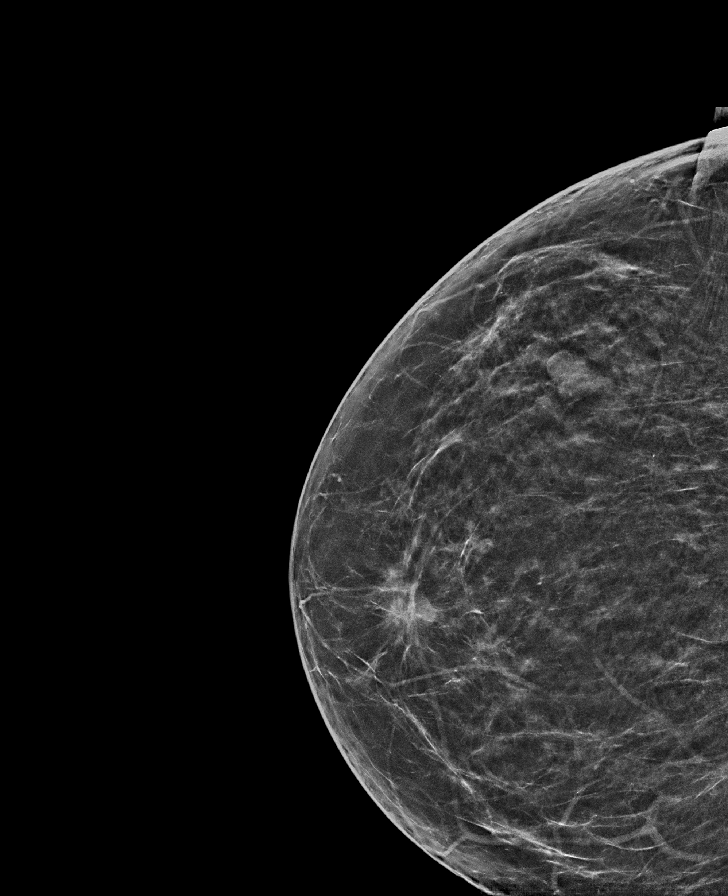

[R CC synth-2D (2 of 2)]
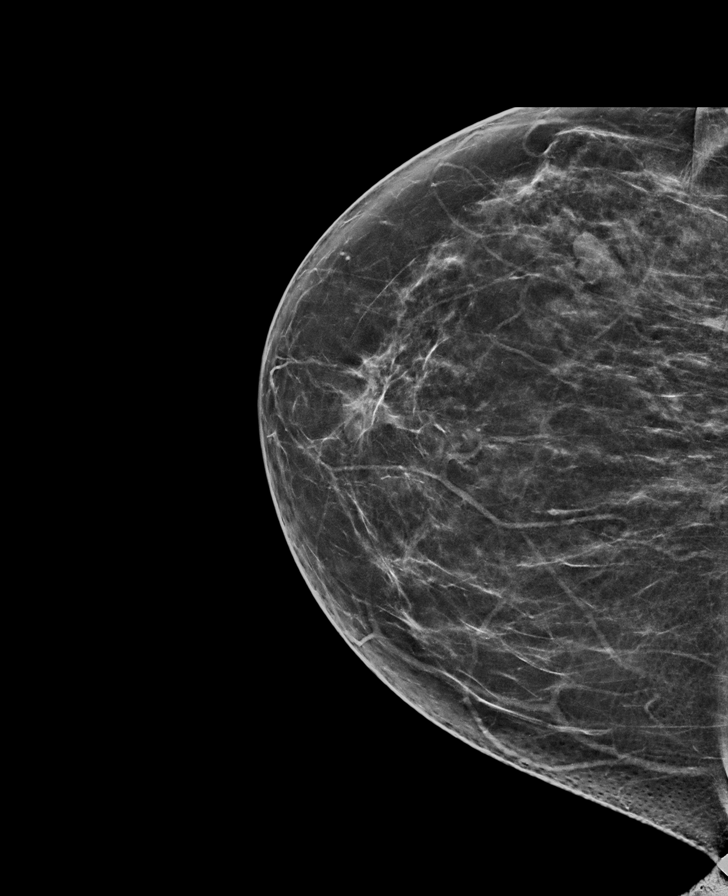

[R MLO synth-2D]
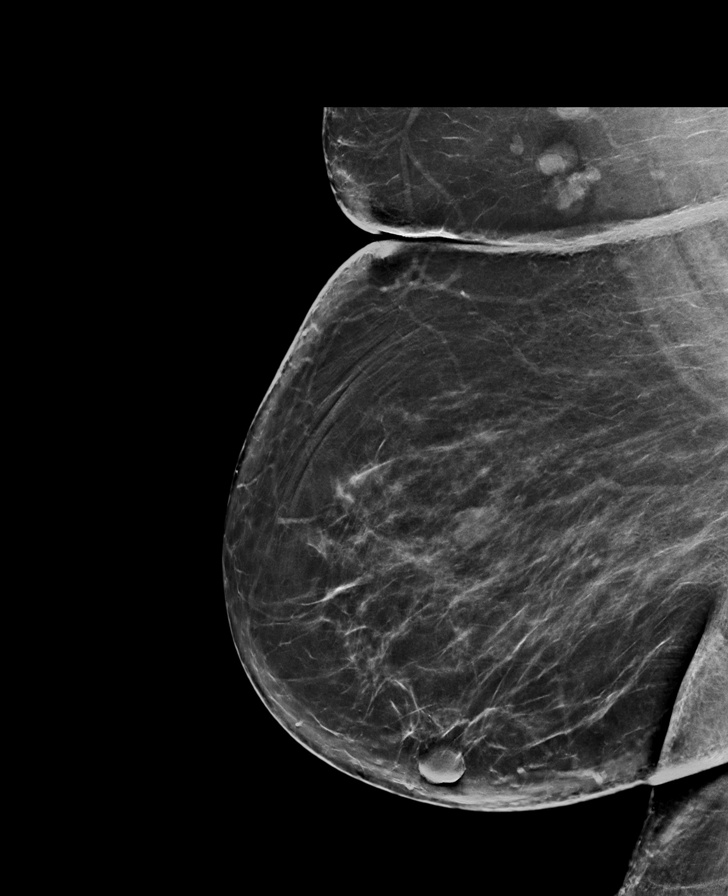

[L MLO synth-2D]
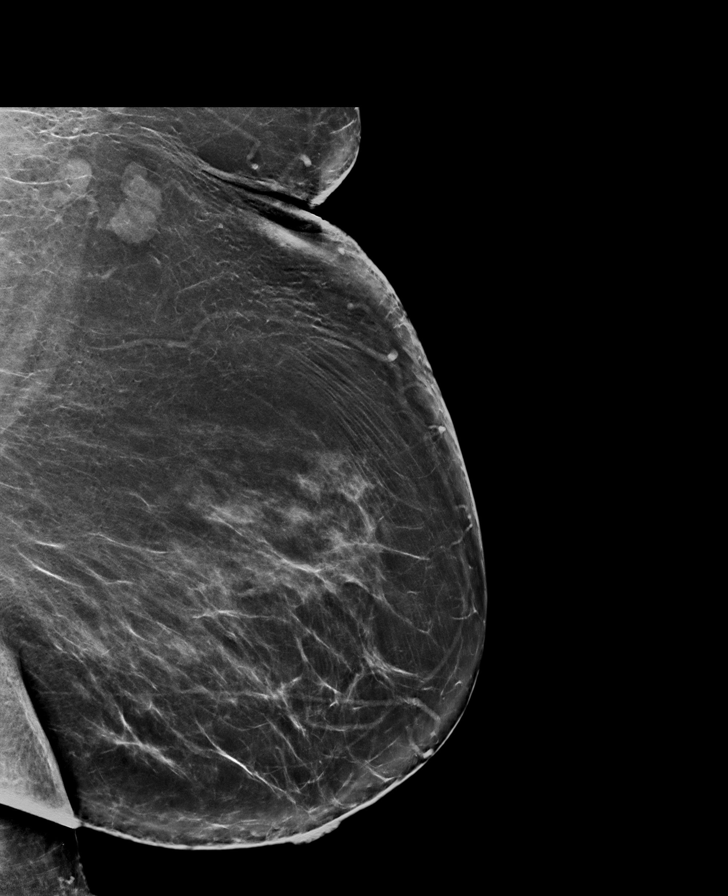

[L CC synth-2D (2 of 2)]
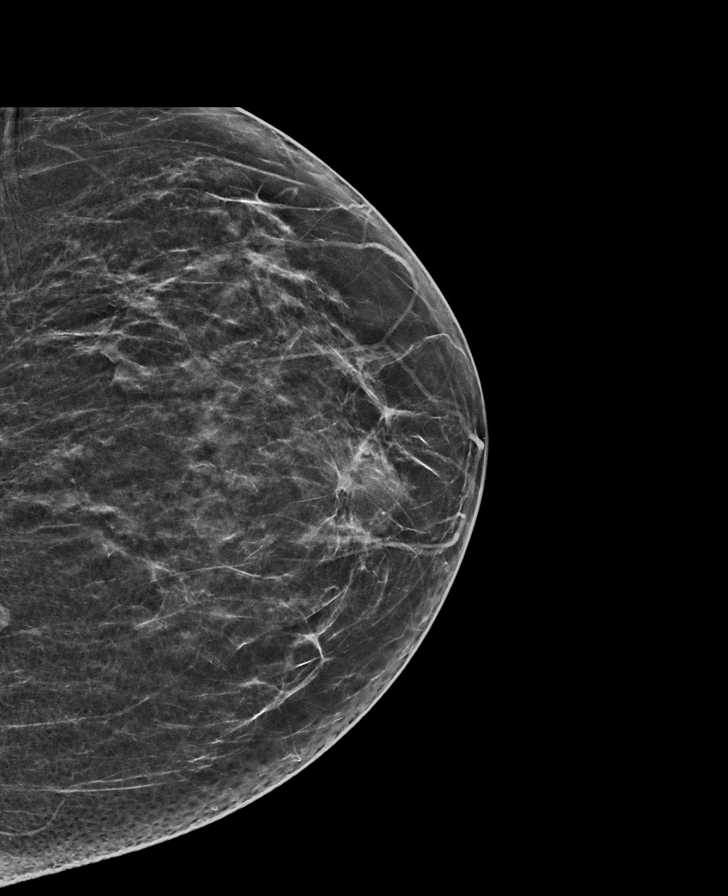

[L CC tomo · tomo slice 33/64.0]
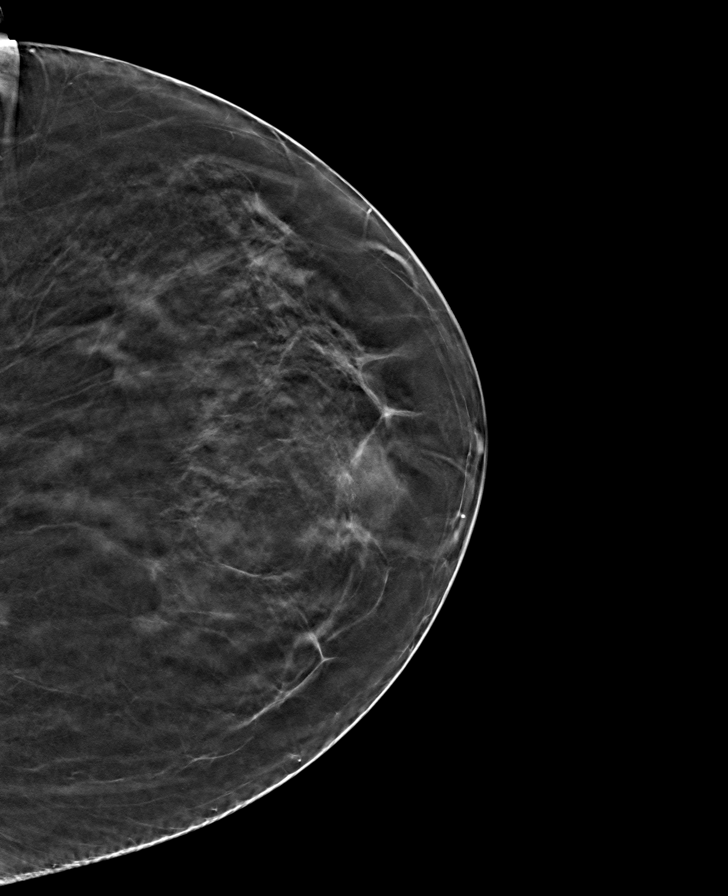

[8 of 40 positions shown; findings below may reference images not displayed]

ACR Breast Density Category b: There are scattered areas of
fibroglandular density.
FINDINGS: In the right breast, a possible mass warrants further evaluation. In
the left breast, no findings suspicious for malignancy.
IMPRESSION: Further evaluation is suggested for a possible mass in the right
breast.

RECOMMENDATION:
Diagnostic mammogram and possibly ultrasound of the right breast.
(Code:[id number]-[id number])

The patient will be contacted regarding the findings, and additional
imaging will be scheduled.

BI-RADS CATEGORY  0: Incomplete. Need additional imaging evaluation
and/or prior mammograms for comparison.

## 2022-09-09 ENCOUNTER — Other Ambulatory Visit (HOSPITAL_COMMUNITY): Payer: Self-pay | Admitting: Internal Medicine

## 2022-09-09 DIAGNOSIS — Z1231 Encounter for screening mammogram for malignant neoplasm of breast: Secondary | ICD-10-CM

## 2022-10-01 ENCOUNTER — Ambulatory Visit (HOSPITAL_COMMUNITY): Payer: Self-pay

## 2022-12-09 ENCOUNTER — Ambulatory Visit (HOSPITAL_COMMUNITY): Payer: Self-pay

## 2022-12-24 ENCOUNTER — Ambulatory Visit (HOSPITAL_COMMUNITY): Payer: Self-pay

## 2023-09-09 ENCOUNTER — Other Ambulatory Visit (HOSPITAL_COMMUNITY): Payer: Self-pay | Admitting: Internal Medicine

## 2023-09-09 DIAGNOSIS — Z1231 Encounter for screening mammogram for malignant neoplasm of breast: Secondary | ICD-10-CM

## 2023-09-09 DIAGNOSIS — M810 Age-related osteoporosis without current pathological fracture: Secondary | ICD-10-CM

## 2023-09-23 ENCOUNTER — Encounter (HOSPITAL_COMMUNITY): Payer: Self-pay

## 2023-09-23 ENCOUNTER — Ambulatory Visit (HOSPITAL_COMMUNITY)
Admission: RE | Admit: 2023-09-23 | Discharge: 2023-09-23 | Disposition: A | Discharge status: Home / Self Care | Payer: Self-pay | Source: Ambulatory Visit | Attending: Internal Medicine | Admitting: Internal Medicine

## 2023-09-23 DIAGNOSIS — M810 Age-related osteoporosis without current pathological fracture: Secondary | ICD-10-CM | POA: Insufficient documentation

## 2023-09-23 DIAGNOSIS — Z1231 Encounter for screening mammogram for malignant neoplasm of breast: Secondary | ICD-10-CM | POA: Diagnosis present

## 2023-10-04 ENCOUNTER — Encounter (HOSPITAL_COMMUNITY): Payer: Self-pay

## 2023-10-04 ENCOUNTER — Emergency Department (HOSPITAL_COMMUNITY)
Admission: EM | Admit: 2023-10-04 | Discharge: 2023-10-05 | Disposition: A | Discharge status: Home / Self Care | Attending: Emergency Medicine | Admitting: Emergency Medicine

## 2023-10-04 ENCOUNTER — Other Ambulatory Visit: Payer: Self-pay

## 2023-10-04 ENCOUNTER — Emergency Department (HOSPITAL_COMMUNITY)

## 2023-10-04 DIAGNOSIS — Z79899 Other long term (current) drug therapy: Secondary | ICD-10-CM | POA: Insufficient documentation

## 2023-10-04 DIAGNOSIS — B9789 Other viral agents as the cause of diseases classified elsewhere: Secondary | ICD-10-CM | POA: Diagnosis not present

## 2023-10-04 DIAGNOSIS — J45909 Unspecified asthma, uncomplicated: Secondary | ICD-10-CM | POA: Diagnosis not present

## 2023-10-04 DIAGNOSIS — E119 Type 2 diabetes mellitus without complications: Secondary | ICD-10-CM | POA: Diagnosis not present

## 2023-10-04 DIAGNOSIS — J069 Acute upper respiratory infection, unspecified: Secondary | ICD-10-CM | POA: Diagnosis not present

## 2023-10-04 DIAGNOSIS — Z7984 Long term (current) use of oral hypoglycemic drugs: Secondary | ICD-10-CM | POA: Diagnosis not present

## 2023-10-04 DIAGNOSIS — I10 Essential (primary) hypertension: Secondary | ICD-10-CM | POA: Diagnosis not present

## 2023-10-04 DIAGNOSIS — R059 Cough, unspecified: Secondary | ICD-10-CM | POA: Diagnosis present

## 2023-10-04 LAB — RESP PANEL BY RT-PCR (RSV, FLU A&B, COVID)  RVPGX2
Influenza A by PCR: NEGATIVE
Influenza B by PCR: NEGATIVE
Resp Syncytial Virus by PCR: NEGATIVE
SARS Coronavirus 2 by RT PCR: NEGATIVE

## 2023-10-04 LAB — CBC WITH DIFFERENTIAL/PLATELET
Abs Immature Granulocytes: 0.04 10*3/uL (ref 0.00–0.07)
Basophils Absolute: 0 10*3/uL (ref 0.0–0.1)
Basophils Relative: 0 %
Eosinophils Absolute: 0.2 10*3/uL (ref 0.0–0.5)
Eosinophils Relative: 2 %
HCT: 38.4 % (ref 36.0–46.0)
Hemoglobin: 11.9 g/dL — ABNORMAL LOW (ref 12.0–15.0)
Immature Granulocytes: 0 %
Lymphocytes Relative: 38 %
Lymphs Abs: 3.5 10*3/uL (ref 0.7–4.0)
MCH: 25.4 pg — ABNORMAL LOW (ref 26.0–34.0)
MCHC: 31 g/dL (ref 30.0–36.0)
MCV: 82.1 fL (ref 80.0–100.0)
Monocytes Absolute: 0.3 10*3/uL (ref 0.1–1.0)
Monocytes Relative: 4 %
Neutro Abs: 5.2 10*3/uL (ref 1.7–7.7)
Neutrophils Relative %: 56 %
Platelets: 230 10*3/uL (ref 150–400)
RBC: 4.68 MIL/uL (ref 3.87–5.11)
RDW: 14.1 % (ref 11.5–15.5)
WBC: 9.2 10*3/uL (ref 4.0–10.5)
nRBC: 0 % (ref 0.0–0.2)

## 2023-10-04 LAB — COMPREHENSIVE METABOLIC PANEL WITH GFR
ALT: 46 U/L — ABNORMAL HIGH (ref 0–44)
AST: 47 U/L — ABNORMAL HIGH (ref 15–41)
Albumin: 3.2 g/dL — ABNORMAL LOW (ref 3.5–5.0)
Alkaline Phosphatase: 66 U/L (ref 38–126)
Anion gap: 8 (ref 5–15)
BUN: 13 mg/dL (ref 6–20)
CO2: 25 mmol/L (ref 22–32)
Calcium: 8.8 mg/dL — ABNORMAL LOW (ref 8.9–10.3)
Chloride: 106 mmol/L (ref 98–111)
Creatinine, Ser: 0.63 mg/dL (ref 0.44–1.00)
GFR, Estimated: >60 mL/min (ref >60)
Glucose, Bld: 104 mg/dL — ABNORMAL HIGH (ref 70–99)
Potassium: 3.6 mmol/L (ref 3.5–5.1)
Sodium: 139 mmol/L (ref 135–145)
Total Bilirubin: 0.3 mg/dL (ref 0.0–1.2)
Total Protein: 6.9 g/dL (ref 6.5–8.1)

## 2023-10-04 MED ORDER — HYDROCODONE BIT-HOMATROP MBR 5-1.5 MG/5ML PO SOLN
5.0000 mL | Freq: Once | ORAL | Status: AC
  Administered 2023-10-04: 5 mL via ORAL
  Filled 2023-10-04: qty 5

## 2023-10-04 MED ORDER — SODIUM CHLORIDE 0.9 % IV BOLUS
1000.0000 mL | Freq: Once | INTRAVENOUS | Status: AC
  Administered 2023-10-04: 1000 mL via INTRAVENOUS

## 2023-10-04 MED ORDER — ONDANSETRON HCL 4 MG/2ML IJ SOLN
4.0000 mg | Freq: Once | INTRAMUSCULAR | Status: AC
  Administered 2023-10-04: 4 mg via INTRAVENOUS
  Filled 2023-10-04: qty 2

## 2023-10-04 MED ORDER — KETOROLAC TROMETHAMINE 30 MG/ML IJ SOLN
15.0000 mg | Freq: Once | INTRAMUSCULAR | Status: AC
  Administered 2023-10-04: 15 mg via INTRAVENOUS
  Filled 2023-10-04: qty 1

## 2023-10-04 MED ORDER — HYDROCODONE BIT-HOMATROP MBR 5-1.5 MG/5ML PO SOLN: 5.0000 mL | Freq: Four times a day (QID) | ORAL | 0 refills | Status: AC | PRN

## 2023-10-04 MED ORDER — ONDANSETRON HCL 4 MG PO TABS: 4.0000 mg | ORAL_TABLET | Freq: Three times a day (TID) | ORAL | 0 refills | Status: AC | PRN

## 2023-10-04 NOTE — Discharge Instructions (Addendum)
 We evaluated you for your cough.  Your chest x-ray was clear.  We did not see any signs of a pneumonia.  Your symptoms are likely due to persistent viral infection.  We have prescribed you cough medication.  This medication can make you drowsy and does contain an opiate medicine.  Do not mix with alcohol.  Do not drive when taking this medication.  Be careful to take this medication only as needed.  Please follow-up closely with your primary care doctor.  If you develop any new or worsening symptoms, such as difficulty breathing, fevers, or any other new symptoms, please return to the emergency department.

## 2023-10-04 NOTE — ED Triage Notes (Addendum)
 Pt reports cough, hoarse, and vomiting since last Monday, pt says she went to urgent care last Monday and was told she had URI, prescribed cough syrup with promethazine and allergy meds but says meds are not helping. Pt says Covid and flu were negative

## 2023-10-04 NOTE — ED Provider Notes (Signed)
 [place] EMERGENCY DEPARTMENT AT [facility] Provider Note  CSN: [id number] Arrival date & time: 10/04/23 1922  Chief Complaint(s) URI  HPI Robin Murray is a 58 y.o. female history of diabetes, hypertension presenting to the emergency department with cough.  Patient reports cough for the past 1 weeks which has been persistent.  She reports initially had some sore throat which is resolved.  No productive cough.  She reports associated nausea, vomiting.  No diarrhea.  No chest pain.  No abdominal pain.  No fevers or chills.  Was prescribed some cough medication however has not helped.  No leg swelling.   Past Medical History Past Medical History:  Diagnosis Date   Asthma    hx of, no problems now   Diabetes mellitus without complication (H[facility])    Fibroid tumor    Hypertension    MMT (medial meniscus tear)    left   Sciatica    Patient Active Problem List   Diagnosis Date Noted   Acute medial meniscal tear 04/24/2021   Unilateral primary osteoarthritis, left knee 04/24/2021   Fibroid uterus 09/25/2014   Home Medication(s) Prior to Admission medications   Medication Sig Start Date End Date Taking? Authorizing Provider  HYDROcodone bit-homatropine (HYCODAN) 5-1.5 MG/5ML syrup Take 5 mLs by mouth every 6 (six) hours as needed for cough. 10/04/23  Yes Lonell Grandchild, MD  ondansetron (ZOFRAN) 4 MG tablet Take 1 tablet (4 mg total) by mouth every 8 (eight) hours as needed for nausea or vomiting. 10/04/23  Yes Lonell Grandchild, MD  albuterol (VENTOLIN HFA) 108 (90 Base) MCG/ACT inhaler Inhale 2 puffs into the lungs every 6 (six) hours as needed for wheezing or shortness of breath.    [provider]  amLODipine (NORVASC) 10 MG tablet Take 10 mg by mouth daily. 02/20/21   [provider]  chlorthalidone (HYGROTON) 25 MG tablet Take 25 mg by mouth daily.    [provider]  gabapentin (NEURONTIN) 300 MG capsule Take 300 mg by mouth 3 (three)  times daily. 02/20/21   [provider]  losartan (COZAAR) 50 MG tablet Take 50 mg by mouth daily. 02/20/21   [provider]  metFORMIN (GLUCOPHAGE) 1000 MG tablet Take by mouth 2 (two) times daily with a meal.    [provider]  NOVOLIN N FLEXPEN RELION 100 UNIT/ML KwikPen Inject 20 Units into the skin at bedtime. 02/20/21   [provider]                                                                                                                                    Past Surgical History Past Surgical History:  Procedure Laterality Date   ABDOMINAL HYSTERECTOMY     BREAST BIOPSY Right 10/10/2020   Fibroadenomatoid   CHOLECYSTECTOMY     KNEE ARTHROSCOPY WITH MEDIAL MENISECTOMY Left 05/09/2021   Procedure: LEFT KNEE ARTHROSCOPY WITH PARTIAL MEDIAL  MENISCECTOMY;  Surgeon: Eldred Manges, MD;  Location: [facility];  Service: Orthopedics;  Laterality: Left;   TUBAL LIGATION     Family History Family History  Problem Relation Age of Onset   Heart disease Mother    Heart attack Mother    Diabetes Mother    Hypertension Mother    Cancer Mother        Breast, kidney, thyroid   Breast cancer Mother    Anuerysm Father    Hypertension Father    Diabetes Sister    Hypertension Sister    Diabetes Brother    Hypertension Brother    Diabetes Sister    Hypertension Sister    Diabetes Sister    Hypertension Sister    Diabetes Sister    Hypertension Sister    Diabetes Brother    Hypertension Brother    Diabetes Brother    Hypertension Brother    Diabetes Brother    Hypertension Brother    Diabetes Brother    Hypertension Brother    Diabetes Brother    Hypertension Brother    Diabetes Brother    Hypertension Brother    Diabetes Brother    Hypertension Brother    Diabetes Brother    Hypertension Brother    Diabetes Sister    Hypertension Sister    Diabetes Sister    Hypertension Sister    Breast cancer Maternal Aunt    Breast  cancer Maternal Aunt     Social History Social History   Tobacco Use   Smoking status: Never   Smokeless tobacco: Never  Substance Use Topics   Alcohol use: No   Drug use: No   Allergies Lisinopril  Review of Systems Review of Systems  All other systems reviewed and are negative.   Physical Exam Vital Signs  I have reviewed the triage vital signs BP (!) 155/88   Pulse 85   Temp 98.9 F (37.2 C) (Oral)   Resp 18   Ht 5\' 3"  (1.6 m)   Wt 117.9 kg   LMP 09/23/2014   SpO2 94%   BMI 46.06 kg/m  Physical Exam Vitals and nursing note reviewed.  Constitutional:      General: She is not in acute distress.    Appearance: She is well-developed.  HENT:     Head: Normocephalic and atraumatic.     Mouth/Throat:     Mouth: Mucous membranes are moist.  Eyes:     Pupils: Pupils are equal, round, and reactive to light.  Cardiovascular:     Rate and Rhythm: Normal rate and regular rhythm.     Heart sounds: No murmur heard. Pulmonary:     Effort: Pulmonary effort is normal. No respiratory distress.     Breath sounds: Normal breath sounds.     Comments: Frequent cough Abdominal:     General: Abdomen is flat.     Palpations: Abdomen is soft.     Tenderness: There is no abdominal tenderness.  Musculoskeletal:        General: No tenderness.     Right lower leg: No edema.     Left lower leg: No edema.  Skin:    General: Skin is warm and dry.  Neurological:     General: No focal deficit present.     Mental Status: She is alert. Mental status is at baseline.  Psychiatric:        Mood and Affect: Mood normal.  Behavior: Behavior normal.     ED Results and Treatments Labs (all labs ordered are listed, but only abnormal results are displayed) Labs Reviewed  COMPREHENSIVE METABOLIC PANEL WITH GFR - Abnormal; Notable for the following components:      Result Value   Glucose, Bld 104 (*)    Calcium 8.8 (*)    Albumin 3.2 (*)    AST 47 (*)    ALT 46 (*)    All  other components within normal limits  CBC WITH DIFFERENTIAL/PLATELET - Abnormal; Notable for the following components:   Hemoglobin 11.9 (*)    MCH 25.4 (*)    All other components within normal limits  RESP PANEL BY RT-PCR (RSV, FLU A&B, COVID)  RVPGX2                                                                                                                          Radiology DG Chest Port 1 View Result Date: 10/04/2023 CLINICAL DATA:  Cough EXAM: PORTABLE CHEST 1 VIEW COMPARISON:  None Available. FINDINGS: Heart and mediastinal contours are within normal limits. No focal opacities or effusions. No acute bony abnormality. Aortic atherosclerosis. IMPRESSION: No active disease. Electronically Signed   By: Charlett Nose M.D.   On: 10/04/2023 22:34    Pertinent labs & imaging results that were available during my care of the patient were reviewed by me and considered in my medical decision making (see MDM for details).  Medications Ordered in ED Medications  ondansetron (ZOFRAN) injection 4 mg (4 mg Intravenous Given 10/04/23 2231)  sodium chloride 0.9 % bolus 1,000 mL (1,000 mLs Intravenous New Bag/Given 10/04/23 2235)  ketorolac (TORADOL) 30 MG/ML injection 15 mg (15 mg Intravenous Given 10/04/23 2231)  HYDROcodone bit-homatropine (HYCODAN) 5-1.5 MG/5ML syrup 5 mL (5 mLs Oral Given 10/04/23 2241)                                                                                                                                     Procedures Procedures  (including critical care time)  Medical Decision Making / ED Course   MDM:  58 year old presenting to the emergency room with cough.  Patient well-appearing, physical examination reassuring, lungs clear on exam.  No hypoxia.  Suspect likely URI or postviral cough.  Will check flu and COVID swab although this was reportedly negative earlier.  Will check chest x-ray given persistent  symptoms but lower concern for pneumonia, pneumothorax,  other acute pulmonary process.  She is also having some nausea and vomiting, reports lots of recent nausea and vomiting, so we will check basic labs, evaluate for dehydration or electrolyte disturbance.  Will give IV fluids, pain control, nausea medication, cough medication.  Will reassess.  If x-ray is negative and labs reassuring, likely discharge.  Clinical Course as of 10/05/23 0002  Tue Oct 05, 2023  0002 Patient feeling better.  Chest x-ray is clear.  Labs are reassuring.  Will prescribe some cough syrup.  Discussed that this is an opioid and and only taken as needed, not to drive or drink alcohol with this medicine.  Advise close follow-up with primary doctor.  Suspect postviral cough. Will discharge patient to home. All questions answered. Patient comfortable with plan of discharge. Return precautions discussed with patient and specified on the after visit summary.  [WS]    Clinical Course User Index [WS] Lonell Grandchild, MD     Additional history obtained: -Additional history obtained from spouse -External records from outside source obtained and reviewed including: Chart review including previous notes, labs, imaging, consultation notes including prior notes    Lab Tests: -I ordered, reviewed, and interpreted labs.   The pertinent results include:   Labs Reviewed  COMPREHENSIVE METABOLIC PANEL WITH GFR - Abnormal; Notable for the following components:      Result Value   Glucose, Bld 104 (*)    Calcium 8.8 (*)    Albumin 3.2 (*)    AST 47 (*)    ALT 46 (*)    All other components within normal limits  CBC WITH DIFFERENTIAL/PLATELET - Abnormal; Notable for the following components:   Hemoglobin 11.9 (*)    MCH 25.4 (*)    All other components within normal limits  RESP PANEL BY RT-PCR (RSV, FLU A&B, COVID)  RVPGX2    Notable for mild anemia   Imaging Studies ordered: I ordered imaging studies including CXR On my interpretation imaging demonstrates clear lungs  I  independently visualized and interpreted imaging. I agree with the radiologist interpretation   Medicines ordered and prescription drug management: Meds ordered this encounter  Medications   ondansetron (ZOFRAN) injection 4 mg   sodium chloride 0.9 % bolus 1,000 mL   ketorolac (TORADOL) 30 MG/ML injection 15 mg   HYDROcodone bit-homatropine (HYCODAN) 5-1.5 MG/5ML syrup 5 mL    Refill:  0   HYDROcodone bit-homatropine (HYCODAN) 5-1.5 MG/5ML syrup    Sig: Take 5 mLs by mouth every 6 (six) hours as needed for cough.    Dispense:  120 mL    Refill:  0   ondansetron (ZOFRAN) 4 MG tablet    Sig: Take 1 tablet (4 mg total) by mouth every 8 (eight) hours as needed for nausea or vomiting.    Dispense:  12 tablet    Refill:  0    -I have reviewed the patients home medicines and have made adjustments as needed    Social Determinants of Health:  Diagnosis or treatment significantly limited by social determinants of health: obesity   Reevaluation: After the interventions noted above, I reevaluated the patient and found that their symptoms have improved  Co morbidities that complicate the patient evaluation  Past Medical History:  Diagnosis Date   Asthma    hx of, no problems now   Diabetes mellitus without complication (HCC)    Fibroid tumor    Hypertension    MMT (medial meniscus  tear)    left   Sciatica       Dispostion: Disposition decision including need for hospitalization was considered, and patient discharged from emergency department.    Final Clinical Impression(s) / ED Diagnoses Final diagnoses:  Viral upper respiratory tract infection     This chart was dictated using voice recognition software.  Despite best efforts to proofread,  errors can occur which can change the documentation meaning.    Lonell Grandchild, MD 10/05/23 Marlyne Beards

## 2024-06-21 ENCOUNTER — Encounter (INDEPENDENT_AMBULATORY_CARE_PROVIDER_SITE_OTHER): Payer: Self-pay | Admitting: *Deleted
